# Patient Record
Sex: Male | Born: 1965 | Race: White | Hispanic: No | Marital: Married | Smoking: Never smoker
Health system: Southern US, Community
[De-identification: ages and names within clinical notes are randomized; demographics above are authoritative.]

## PROBLEM LIST (undated history)

## (undated) DIAGNOSIS — I10 Essential (primary) hypertension: Secondary | ICD-10-CM

## (undated) DIAGNOSIS — K219 Gastro-esophageal reflux disease without esophagitis: Secondary | ICD-10-CM

## (undated) DIAGNOSIS — F419 Anxiety disorder, unspecified: Secondary | ICD-10-CM

## (undated) HISTORY — PX: TONSILLECTOMY: SUR1361

---

## 2015-04-15 DIAGNOSIS — IMO0002 Reserved for concepts with insufficient information to code with codable children: Secondary | ICD-10-CM

## 2015-04-15 DIAGNOSIS — E785 Hyperlipidemia, unspecified: Secondary | ICD-10-CM | POA: Diagnosis present

## 2015-05-17 ENCOUNTER — Emergency Department (HOSPITAL_BASED_OUTPATIENT_CLINIC_OR_DEPARTMENT_OTHER): Payer: BLUE CROSS/BLUE SHIELD

## 2015-05-17 ENCOUNTER — Observation Stay (HOSPITAL_BASED_OUTPATIENT_CLINIC_OR_DEPARTMENT_OTHER)
Admission: EM | Admit: 2015-05-17 | Discharge: 2015-05-19 | Disposition: A | Discharge status: Home / Self Care | Payer: BLUE CROSS/BLUE SHIELD | Attending: Surgery | Admitting: Surgery

## 2015-05-17 ENCOUNTER — Encounter (HOSPITAL_BASED_OUTPATIENT_CLINIC_OR_DEPARTMENT_OTHER): Payer: Self-pay | Admitting: Emergency Medicine

## 2015-05-17 DIAGNOSIS — I10 Essential (primary) hypertension: Secondary | ICD-10-CM | POA: Diagnosis not present

## 2015-05-17 DIAGNOSIS — K8010 Calculus of gallbladder with chronic cholecystitis without obstruction: Principal | ICD-10-CM | POA: Insufficient documentation

## 2015-05-17 DIAGNOSIS — K219 Gastro-esophageal reflux disease without esophagitis: Secondary | ICD-10-CM | POA: Diagnosis not present

## 2015-05-17 DIAGNOSIS — D72829 Elevated white blood cell count, unspecified: Secondary | ICD-10-CM

## 2015-05-17 DIAGNOSIS — Z79899 Other long term (current) drug therapy: Secondary | ICD-10-CM | POA: Insufficient documentation

## 2015-05-17 DIAGNOSIS — F419 Anxiety disorder, unspecified: Secondary | ICD-10-CM | POA: Diagnosis present

## 2015-05-17 DIAGNOSIS — Z7982 Long term (current) use of aspirin: Secondary | ICD-10-CM | POA: Insufficient documentation

## 2015-05-17 DIAGNOSIS — IMO0002 Reserved for concepts with insufficient information to code with codable children: Secondary | ICD-10-CM

## 2015-05-17 DIAGNOSIS — K819 Cholecystitis, unspecified: Secondary | ICD-10-CM

## 2015-05-17 DIAGNOSIS — K810 Acute cholecystitis: Secondary | ICD-10-CM | POA: Diagnosis present

## 2015-05-17 DIAGNOSIS — R109 Unspecified abdominal pain: Secondary | ICD-10-CM

## 2015-05-17 DIAGNOSIS — E785 Hyperlipidemia, unspecified: Secondary | ICD-10-CM | POA: Diagnosis not present

## 2015-05-17 DIAGNOSIS — K8020 Calculus of gallbladder without cholecystitis without obstruction: Secondary | ICD-10-CM

## 2015-05-17 HISTORY — DX: Essential (primary) hypertension: I10

## 2015-05-17 HISTORY — DX: Anxiety disorder, unspecified: F41.9

## 2015-05-17 HISTORY — DX: Gastro-esophageal reflux disease without esophagitis: K21.9

## 2015-05-17 LAB — COMPREHENSIVE METABOLIC PANEL
ALT: 34 U/L (ref 17–63)
AST: 28 U/L (ref 15–41)
Albumin: 4.3 g/dL (ref 3.5–5.0)
Alkaline Phosphatase: 77 U/L (ref 38–126)
Anion gap: 14 (ref 5–15)
BUN: 11 mg/dL (ref 6–20)
CHLORIDE: 104 mmol/L (ref 101–111)
CO2: 23 mmol/L (ref 22–32)
Calcium: 9.6 mg/dL (ref 8.9–10.3)
Creatinine, Ser: 0.89 mg/dL (ref 0.61–1.24)
GFR calc Af Amer: >60 mL/min (ref >60)
Glucose, Bld: 134 mg/dL — ABNORMAL HIGH (ref 65–99)
POTASSIUM: 4 mmol/L (ref 3.5–5.1)
SODIUM: 141 mmol/L (ref 135–145)
Total Bilirubin: 0.9 mg/dL (ref 0.3–1.2)
Total Protein: 7.9 g/dL (ref 6.5–8.1)

## 2015-05-17 LAB — CBC
HEMATOCRIT: 43.4 % (ref 39.0–52.0)
Hemoglobin: 14.4 g/dL (ref 13.0–17.0)
MCH: 28.9 pg (ref 26.0–34.0)
MCHC: 33.2 g/dL (ref 30.0–36.0)
MCV: 87.1 fL (ref 78.0–100.0)
Platelets: 323 10*3/uL (ref 150–400)
RBC: 4.98 MIL/uL (ref 4.22–5.81)
RDW: 12.8 % (ref 11.5–15.5)
WBC: 22.1 10*3/uL — AB (ref 4.0–10.5)

## 2015-05-17 LAB — LIPASE, BLOOD: LIPASE: 28 U/L (ref 11–51)

## 2015-05-17 MED ORDER — SODIUM CHLORIDE 0.9 % IV BOLUS (SEPSIS)
1000.0000 mL | Freq: Once | INTRAVENOUS | Status: AC
  Administered 2015-05-18: 1000 mL via INTRAVENOUS

## 2015-05-17 MED ORDER — FENTANYL CITRATE (PF) 100 MCG/2ML IJ SOLN
100.0000 ug | Freq: Once | INTRAMUSCULAR | Status: AC
  Administered 2015-05-17: 100 ug via INTRAVENOUS
  Filled 2015-05-17: qty 2

## 2015-05-17 MED ORDER — ONDANSETRON HCL 4 MG/2ML IJ SOLN
4.0000 mg | Freq: Once | INTRAMUSCULAR | Status: AC
  Administered 2015-05-17: 4 mg via INTRAVENOUS
  Filled 2015-05-17: qty 2

## 2015-05-17 NOTE — ED Notes (Signed)
Patient states that he has had generalized abdominal pain x 4 hours with nausea.

## 2015-05-17 NOTE — ED Provider Notes (Signed)
CSN: [id number]     Arrival date & time 05/17/15  2214 History  By signing my name below, I, Budd Palmer, attest that this documentation has been prepared under the direction and in the presence of Kasten Leveque, MD. Electronically Signed: Budd Palmer, ED Scribe. 05/17/2015. 11:30 PM.    Chief Complaint  Patient presents with  . Abdominal Pain   Patient is a 50 y.o. male presenting with abdominal pain. The history is provided by the patient and the spouse. No language interpreter was used.  Abdominal Pain Pain location:  Epigastric and RUQ Pain quality: sharp   Pain radiates to:  Back Pain severity:  Severe Onset quality:  Gradual Duration:  6 hours Timing:  Constant Progression:  Waxing and waning Chronicity:  New Context: not medication withdrawal, not sick contacts, not suspicious food intake and not trauma   Relieved by:  Nothing Ineffective treatments:  Antacids and OTC medications Associated symptoms: chills and nausea   Associated symptoms: no constipation, no cough, no diarrhea, no dysuria, no flatus, no shortness of breath and no vomiting   Nausea:    Severity:  Mild   Onset quality:  Gradual   Timing:  Constant   Progression:  Resolved Risk factors: no alcohol abuse and not pregnant    HPI Comments: Jeffery Rivas is a 50 y.o. male who presents to the Emergency Department complaining of waxing and waning upper abdominal pain radiating into the back onset 6 hours ago. He currently rates his pain as an 8/10. He reports associated nausea (resolved), chills, and diaphoresis. Per wife, pt has tried taking gas pills without relief. She notes this was the first day pt has taken 500 mg Norvasc. She states pt has recently been sick with a URI when he was prescribed prednisone and doxycycline. She notes that after this pt had 2 syncopal episodes and was admitted to a hospital in [place] on 1/31. Pt notes a PMHx of GERD. His most recent BM was normal and occurred this morning. He  notes he did not eat right before the pain began and denies any strenuous activities today. He denies any recent sick contacts. He also denies a PSHx of hernia repair. Pt denies v/d, constipation, dysuria, congestion, cough, and SOB.   Past Medical History  Diagnosis Date  . Hypertension   . Anxiety   . GERD (gastroesophageal reflux disease)    Past Surgical History  Procedure Laterality Date  . Tonsillectomy     History reviewed. No pertinent family history. Social History  Substance Use Topics  . Smoking status: Never Smoker   . Smokeless tobacco: None  . Alcohol Use: No    Review of Systems  Constitutional: Positive for chills.  HENT: Negative for congestion.   Respiratory: Negative for cough and shortness of breath.   Gastrointestinal: Positive for nausea and abdominal pain. Negative for vomiting, diarrhea, constipation and flatus.  Genitourinary: Negative for dysuria.  All other systems reviewed and are negative.   Allergies  Review of patient's allergies indicates no known allergies.  Home Medications   Prior to Admission medications   Medication Sig Start Date End Date Taking? Authorizing Provider  amLODipine (NORVASC) 5 MG tablet Take 5 mg by mouth daily.   Yes Historical Provider, MD  aspirin 81 MG tablet Take 81 mg by mouth daily.   Yes Historical Provider, MD  citalopram (CELEXA) 10 MG tablet Take 10 mg by mouth daily.   Yes Historical Provider, MD  enalapril (VASOTEC) 10 MG tablet Take  20 mg by mouth 2 (two) times daily.   Yes Historical Provider, MD  loratadine (CLARITIN) 10 MG tablet Take 10 mg by mouth daily.   Yes Historical Provider, MD  omeprazole (PRILOSEC) 40 MG capsule Take 40 mg by mouth daily.   Yes Historical Provider, MD   BP 135/107 mmHg  Pulse 74  Temp(Src) 97.7 F (36.5 C) (Oral)  Resp 18  Ht 5\' 8"  (1.727 m)  Wt 220 lb (99.791 kg)  BMI 33.46 kg/m2  SpO2 100% Physical Exam  Constitutional: He appears well-developed and well-nourished.   HENT:  Head: Normocephalic and atraumatic.  Mouth/Throat: Oropharynx is clear and moist.  Eyes: Conjunctivae are normal. Pupils are equal, round, and reactive to light. Right eye exhibits no discharge. Left eye exhibits no discharge.  Neck: Normal range of motion. Neck supple.  Cardiovascular: Normal rate, regular rhythm and normal heart sounds.   Pulmonary/Chest: Effort normal and breath sounds normal. No respiratory distress. He has no wheezes. He has no rales.  Lungs are CTA  Abdominal: Soft. He exhibits no distension and no mass. There is tenderness in the right upper quadrant and epigastric area. There is guarding. There is no rigidity, no rebound and no tenderness at McBurney's point.  Neurological: He is alert. Coordination normal.  Skin: Skin is warm and dry. No rash noted. He is not diaphoretic. No erythema.  Psychiatric: He has a normal mood and affect.  Nursing note and vitals reviewed.   ED Course  Procedures  DIAGNOSTIC STUDIES: Oxygen Saturation is 100% on RA, normal by my interpretation.    COORDINATION OF CARE: 11:24 PM - Discussed plans to review pt's medical records and wait on diagnostic studies. Pt advised of plan for treatment and pt agrees.  Labs Review Labs Reviewed  CBC - Abnormal; Notable for the following:    WBC 22.1 (*)    All other components within normal limits  LIPASE, BLOOD  COMPREHENSIVE METABOLIC PANEL  URINALYSIS, ROUTINE W REFLEX MICROSCOPIC (NOT AT [facility])  TROPONIN I    Imaging Review No results found. I have personally reviewed and evaluated these images and lab results as part of my medical decision-making.   EKG Interpretation None      MDM   Final diagnoses:  None     Date: 05/18/2015  Rate: 68  Rhythm: normal sinus rhythm  QRS Axis: normal  Intervals: normal  ST/T Wave abnormalities: normal  Conduction Disutrbances: none  Narrative Interpretation: unremarkable  Medications  fentaNYL (SUBLIMAZE) injection 100 mcg  (not administered)  fentaNYL (SUBLIMAZE) injection 100 mcg (100 mcg Intravenous Given 05/17/15 2335)  ondansetron (ZOFRAN) injection 4 mg (4 mg Intravenous Given 05/17/15 2335)  sodium chloride 0.9 % bolus 1,000 mL (0 mLs Intravenous Stopped 05/18/15 0156)  iohexol (OMNIPAQUE) 300 MG/ML solution 25 mL (25 mLs Oral Contrast Given 05/18/15 0045)  iohexol (OMNIPAQUE) 300 MG/ML solution 100 mL (100 mLs Intravenous Contrast Given 05/18/15 0141)  morphine 4 MG/ML injection 4 mg (4 mg Intravenous Given 05/18/15 0157)  morphine 2 MG/ML injection 2 mg (2 mg Intravenous Given 05/18/15 0245)   Results for orders placed or performed during the hospital encounter of 05/17/15  Lipase, blood  Result Value Ref Range   Lipase 28 11 - 51 U/L  Comprehensive metabolic panel  Result Value Ref Range   Sodium 141 135 - 145 mmol/L   Potassium 4.0 3.5 - 5.1 mmol/L   Chloride 104 101 - 111 mmol/L   CO2 23 22 - 32 mmol/L  Glucose, Bld 134 (H) 65 - 99 mg/dL   BUN 11 6 - 20 mg/dL   Creatinine, Ser 1.61 0.61 - 1.24 mg/dL   Calcium 9.6 8.9 - 09.6 mg/dL   Total Protein 7.9 6.5 - 8.1 g/dL   Albumin 4.3 3.5 - 5.0 g/dL   AST 28 15 - 41 U/L   ALT 34 17 - 63 U/L   Alkaline Phosphatase 77 38 - 126 U/L   Total Bilirubin 0.9 0.3 - 1.2 mg/dL   GFR calc non Af Amer >60 >60 mL/min   GFR calc Af Amer >60 >60 mL/min   Anion gap 14 5 - 15  CBC  Result Value Ref Range   WBC 22.1 (H) 4.0 - 10.5 K/uL   RBC 4.98 4.22 - 5.81 MIL/uL   Hemoglobin 14.4 13.0 - 17.0 g/dL   HCT 04.5 40.9 - 81.1 %   MCV 87.1 78.0 - 100.0 fL   MCH 28.9 26.0 - 34.0 pg   MCHC 33.2 30.0 - 36.0 g/dL   RDW 91.4 78.2 - 95.6 %   Platelets 323 150 - 400 K/uL  Urinalysis, Routine w reflex microscopic (not at [facility])  Result Value Ref Range   Color, Urine YELLOW YELLOW   APPearance CLEAR CLEAR   Specific Gravity, Urine 1.021 1.005 - 1.030   pH 6.0 5.0 - 8.0   Glucose, UA NEGATIVE NEGATIVE mg/dL   Hgb urine dipstick NEGATIVE NEGATIVE   Bilirubin Urine  NEGATIVE NEGATIVE   Ketones, ur >80 (A) NEGATIVE mg/dL   Protein, ur NEGATIVE NEGATIVE mg/dL   Nitrite NEGATIVE NEGATIVE   Leukocytes, UA NEGATIVE NEGATIVE  Troponin I  Result Value Ref Range   Troponin I <0.03 <0.031 ng/mL   Dg Chest 2 View  05/18/2015  CLINICAL DATA:  50 year old male with upper abdominal pain EXAM: CHEST  2 VIEW COMPARISON:  None. FINDINGS: The heart size and mediastinal contours are within normal limits. Both lungs are clear. The visualized skeletal structures are unremarkable. IMPRESSION: No active cardiopulmonary disease. Electronically Signed   By: Elgie Collard M.D.   On: 05/18/2015 00:27   Ct Abdomen Pelvis W Contrast  05/18/2015  CLINICAL DATA:  Acute onset of upper abdominal pain, radiating to the back. Nausea, chills and diaphoresis. Recent syncope. Initial encounter. EXAM: CT ABDOMEN AND PELVIS WITH CONTRAST TECHNIQUE: Multidetector CT imaging of the abdomen and pelvis was performed using the standard protocol following bolus administration of intravenous contrast. CONTRAST:  OMNIPAQUE IOHEXOL 300 MG/ML  SOLN COMPARISON:  None. FINDINGS: The visualized lung bases are clear. The liver and spleen are unremarkable in appearance. There is suggestion of mild soft tissue edema about the gallbladder, which may reflect mild cholecystitis. Would correlate with the patient's symptoms. The pancreas and adrenal glands are unremarkable. The kidneys are unremarkable in appearance. There is no evidence of hydronephrosis. No renal or ureteral stones are seen. Nonspecific perinephric stranding is noted bilaterally. No free fluid is identified. The small bowel is unremarkable in appearance. The stomach is within normal limits. No acute vascular abnormalities are seen. Minimal calcification is noted along the distal abdominal aorta. The appendix is normal in caliber, without evidence of appendicitis. The colon is largely decompressed and grossly unremarkable in appearance. The  bladder is mildly distended and grossly unremarkable. The prostate remains normal in size. No inguinal lymphadenopathy is seen. No acute osseous abnormalities are identified. IMPRESSION: Suggestion of mild soft tissue edema about the gallbladder, which may reflect mild cholecystitis. Would correlate with the patient's symptoms.  Electronically Signed   By: Roanna Raider M.D.   On: 05/18/2015 02:11    Case d/w Dr. Ezzard Standing, ultrasound of the gallbladder to ED at [facility] surgery will see Case d/w Dr. Lynelle Doctor in ED  I personally performed the services described in this documentation, which was scribed in my presence. The recorded information has been reviewed and is accurate.     Cy Blamer, MD 05/18/15 [phone]

## 2015-05-18 ENCOUNTER — Encounter (HOSPITAL_BASED_OUTPATIENT_CLINIC_OR_DEPARTMENT_OTHER): Payer: Self-pay | Admitting: Emergency Medicine

## 2015-05-18 ENCOUNTER — Observation Stay (HOSPITAL_COMMUNITY): Payer: BLUE CROSS/BLUE SHIELD

## 2015-05-18 ENCOUNTER — Encounter (HOSPITAL_COMMUNITY)
Admission: EM | Disposition: A | Discharge status: Home / Self Care | Payer: Self-pay | Source: Home / Self Care | Attending: Emergency Medicine

## 2015-05-18 ENCOUNTER — Emergency Department (HOSPITAL_BASED_OUTPATIENT_CLINIC_OR_DEPARTMENT_OTHER): Payer: BLUE CROSS/BLUE SHIELD

## 2015-05-18 ENCOUNTER — Observation Stay (HOSPITAL_COMMUNITY): Payer: BLUE CROSS/BLUE SHIELD | Admitting: Anesthesiology

## 2015-05-18 ENCOUNTER — Emergency Department (HOSPITAL_COMMUNITY): Payer: BLUE CROSS/BLUE SHIELD

## 2015-05-18 DIAGNOSIS — K810 Acute cholecystitis: Secondary | ICD-10-CM | POA: Diagnosis present

## 2015-05-18 DIAGNOSIS — K219 Gastro-esophageal reflux disease without esophagitis: Secondary | ICD-10-CM | POA: Diagnosis present

## 2015-05-18 DIAGNOSIS — I10 Essential (primary) hypertension: Secondary | ICD-10-CM | POA: Diagnosis present

## 2015-05-18 DIAGNOSIS — F419 Anxiety disorder, unspecified: Secondary | ICD-10-CM | POA: Diagnosis present

## 2015-05-18 HISTORY — PX: CHOLECYSTECTOMY: SHX55

## 2015-05-18 LAB — SURGICAL PCR SCREEN
MRSA, PCR: NEGATIVE
Staphylococcus aureus: NEGATIVE

## 2015-05-18 LAB — URINALYSIS, ROUTINE W REFLEX MICROSCOPIC
BILIRUBIN URINE: NEGATIVE
Glucose, UA: NEGATIVE mg/dL
HGB URINE DIPSTICK: NEGATIVE
Leukocytes, UA: NEGATIVE
Nitrite: NEGATIVE
Protein, ur: NEGATIVE mg/dL
SPECIFIC GRAVITY, URINE: 1.021 (ref 1.005–1.030)
pH: 6 (ref 5.0–8.0)

## 2015-05-18 LAB — TROPONIN I: Troponin I: <0.03 ng/mL (ref <0.031)

## 2015-05-18 SURGERY — LAPAROSCOPIC CHOLECYSTECTOMY WITH INTRAOPERATIVE CHOLANGIOGRAM
Anesthesia: General | Site: Abdomen

## 2015-05-18 MED ORDER — MIDAZOLAM HCL 5 MG/5ML IJ SOLN
INTRAMUSCULAR | Status: DC | PRN
  Administered 2015-05-18: 2 mg via INTRAVENOUS

## 2015-05-18 MED ORDER — IBUPROFEN 200 MG PO TABS: 600.0000 mg | ORAL_TABLET | Freq: Four times a day (QID) | ORAL | Status: DC | PRN

## 2015-05-18 MED ORDER — ONDANSETRON HCL 4 MG/2ML IJ SOLN
4.0000 mg | Freq: Four times a day (QID) | INTRAMUSCULAR | Status: DC | PRN
  Administered 2015-05-18 (×2): 4 mg via INTRAVENOUS
  Filled 2015-05-18 (×2): qty 2

## 2015-05-18 MED ORDER — PIPERACILLIN-TAZOBACTAM 3.375 G IVPB
3.3750 g | Freq: Three times a day (TID) | INTRAVENOUS | Status: DC
  Administered 2015-05-18 – 2015-05-19 (×4): 3.375 g via INTRAVENOUS
  Filled 2015-05-18 (×5): qty 50

## 2015-05-18 MED ORDER — HYDROCODONE-ACETAMINOPHEN 5-325 MG PO TABS
1.0000 | ORAL_TABLET | ORAL | Status: DC | PRN
  Administered 2015-05-19: 1 via ORAL
  Filled 2015-05-18: qty 1

## 2015-05-18 MED ORDER — ONDANSETRON 4 MG PO TBDP: 4.0000 mg | ORAL_TABLET | Freq: Four times a day (QID) | ORAL | Status: DC | PRN

## 2015-05-18 MED ORDER — BUPIVACAINE-EPINEPHRINE (PF) 0.25% -1:200000 IJ SOLN
INTRAMUSCULAR | Status: AC
  Filled 2015-05-18: qty 30

## 2015-05-18 MED ORDER — HEPARIN SODIUM (PORCINE) 5000 UNIT/ML IJ SOLN
5000.0000 [IU] | Freq: Three times a day (TID) | INTRAMUSCULAR | Status: DC
  Administered 2015-05-18 – 2015-05-19 (×2): 5000 [IU] via SUBCUTANEOUS
  Filled 2015-05-18 (×5): qty 1

## 2015-05-18 MED ORDER — FENTANYL CITRATE (PF) 250 MCG/5ML IJ SOLN
INTRAMUSCULAR | Status: AC
  Filled 2015-05-18: qty 5

## 2015-05-18 MED ORDER — LIDOCAINE HCL (CARDIAC) 20 MG/ML IV SOLN
INTRAVENOUS | Status: DC | PRN
  Administered 2015-05-18: 100 mg via INTRAVENOUS

## 2015-05-18 MED ORDER — FENTANYL CITRATE (PF) 100 MCG/2ML IJ SOLN
25.0000 ug | INTRAMUSCULAR | Status: DC | PRN
  Administered 2015-05-18 (×2): 25 ug via INTRAVENOUS
  Filled 2015-05-18 (×2): qty 2

## 2015-05-18 MED ORDER — IOHEXOL 300 MG/ML  SOLN
INTRAMUSCULAR | Status: DC | PRN
  Administered 2015-05-18: 5 mL

## 2015-05-18 MED ORDER — ONDANSETRON HCL 4 MG/2ML IJ SOLN: 4.0000 mg | Freq: Once | INTRAMUSCULAR | Status: DC | PRN

## 2015-05-18 MED ORDER — LACTATED RINGERS IR SOLN
Status: DC | PRN
  Administered 2015-05-18: 1

## 2015-05-18 MED ORDER — MORPHINE SULFATE (PF) 4 MG/ML IV SOLN: 4.0000 mg | Freq: Once | INTRAVENOUS | Status: DC

## 2015-05-18 MED ORDER — KCL IN DEXTROSE-NACL 20-5-0.45 MEQ/L-%-% IV SOLN
INTRAVENOUS | Status: DC
  Administered 2015-05-18: 100 mL/h via INTRAVENOUS
  Administered 2015-05-19: 02:00:00 via INTRAVENOUS
  Filled 2015-05-18 (×3): qty 1000

## 2015-05-18 MED ORDER — PIPERACILLIN-TAZOBACTAM 3.375 G IVPB: 3.3750 g | Freq: Three times a day (TID) | INTRAVENOUS | Status: DC

## 2015-05-18 MED ORDER — LACTATED RINGERS IV SOLN: INTRAVENOUS | Status: DC

## 2015-05-18 MED ORDER — ONDANSETRON HCL 4 MG/2ML IJ SOLN: 4.0000 mg | Freq: Four times a day (QID) | INTRAMUSCULAR | Status: DC | PRN

## 2015-05-18 MED ORDER — ONDANSETRON HCL 4 MG/2ML IJ SOLN
INTRAMUSCULAR | Status: AC
  Filled 2015-05-18: qty 2

## 2015-05-18 MED ORDER — LIDOCAINE HCL (CARDIAC) 20 MG/ML IV SOLN
INTRAVENOUS | Status: AC
  Filled 2015-05-18: qty 5

## 2015-05-18 MED ORDER — LACTATED RINGERS IV SOLN
INTRAVENOUS | Status: DC | PRN
  Administered 2015-05-18: 10:00:00 via INTRAVENOUS

## 2015-05-18 MED ORDER — MORPHINE SULFATE (PF) 2 MG/ML IV SOLN: 1.0000 mg | INTRAVENOUS | Status: DC | PRN

## 2015-05-18 MED ORDER — FENTANYL CITRATE (PF) 100 MCG/2ML IJ SOLN
INTRAMUSCULAR | Status: AC
  Filled 2015-05-18: qty 2

## 2015-05-18 MED ORDER — PIPERACILLIN-TAZOBACTAM 3.375 G IVPB
INTRAVENOUS | Status: AC
  Filled 2015-05-18: qty 50

## 2015-05-18 MED ORDER — MEPERIDINE HCL 50 MG/ML IJ SOLN: 6.2500 mg | INTRAMUSCULAR | Status: DC | PRN

## 2015-05-18 MED ORDER — HYDROMORPHONE HCL 1 MG/ML IJ SOLN: 0.2500 mg | INTRAMUSCULAR | Status: DC | PRN

## 2015-05-18 MED ORDER — SUGAMMADEX SODIUM 200 MG/2ML IV SOLN
INTRAVENOUS | Status: DC | PRN
  Administered 2015-05-18: 200 mg via INTRAVENOUS

## 2015-05-18 MED ORDER — IOHEXOL 300 MG/ML  SOLN
25.0000 mL | Freq: Once | INTRAMUSCULAR | Status: AC | PRN
  Administered 2015-05-18: 25 mL via ORAL

## 2015-05-18 MED ORDER — FENTANYL CITRATE (PF) 100 MCG/2ML IJ SOLN
100.0000 ug | Freq: Once | INTRAMUSCULAR | Status: AC
  Administered 2015-05-18: 100 ug via INTRAVENOUS
  Filled 2015-05-18: qty 2

## 2015-05-18 MED ORDER — FENTANYL CITRATE (PF) 100 MCG/2ML IJ SOLN
50.0000 ug | INTRAMUSCULAR | Status: DC | PRN
  Administered 2015-05-18: 50 ug via INTRAVENOUS
  Filled 2015-05-18: qty 2

## 2015-05-18 MED ORDER — SUGAMMADEX SODIUM 200 MG/2ML IV SOLN
INTRAVENOUS | Status: AC
  Filled 2015-05-18: qty 2

## 2015-05-18 MED ORDER — LABETALOL HCL 5 MG/ML IV SOLN
INTRAVENOUS | Status: DC | PRN
  Administered 2015-05-18 (×2): 5 mg via INTRAVENOUS

## 2015-05-18 MED ORDER — ONDANSETRON HCL 4 MG/2ML IJ SOLN
INTRAMUSCULAR | Status: DC | PRN
  Administered 2015-05-18: 4 mg via INTRAVENOUS

## 2015-05-18 MED ORDER — 0.9 % SODIUM CHLORIDE (POUR BTL) OPTIME
TOPICAL | Status: DC | PRN
  Administered 2015-05-18: 1000 mL

## 2015-05-18 MED ORDER — LABETALOL HCL 5 MG/ML IV SOLN
INTRAVENOUS | Status: AC
  Filled 2015-05-18: qty 4

## 2015-05-18 MED ORDER — MIDAZOLAM HCL 2 MG/2ML IJ SOLN
INTRAMUSCULAR | Status: AC
  Filled 2015-05-18: qty 2

## 2015-05-18 MED ORDER — ROCURONIUM BROMIDE 100 MG/10ML IV SOLN
INTRAVENOUS | Status: AC
  Filled 2015-05-18: qty 1

## 2015-05-18 MED ORDER — IOHEXOL 300 MG/ML  SOLN
100.0000 mL | Freq: Once | INTRAMUSCULAR | Status: AC | PRN
  Administered 2015-05-18: 100 mL via INTRAVENOUS

## 2015-05-18 MED ORDER — PROPOFOL 10 MG/ML IV BOLUS
INTRAVENOUS | Status: DC | PRN
Start: 2015-05-18 — End: 2015-05-18
  Administered 2015-05-18: 250 mg via INTRAVENOUS

## 2015-05-18 MED ORDER — MORPHINE SULFATE (PF) 2 MG/ML IV SOLN
2.0000 mg | Freq: Once | INTRAVENOUS | Status: AC
  Administered 2015-05-18: 2 mg via INTRAVENOUS
  Filled 2015-05-18: qty 1

## 2015-05-18 MED ORDER — MORPHINE SULFATE (PF) 4 MG/ML IV SOLN
4.0000 mg | Freq: Once | INTRAVENOUS | Status: AC
Start: 2015-05-18 — End: 2015-05-18
  Administered 2015-05-18: 4 mg via INTRAVENOUS
  Filled 2015-05-18: qty 1

## 2015-05-18 MED ORDER — FENTANYL CITRATE (PF) 100 MCG/2ML IJ SOLN
INTRAMUSCULAR | Status: DC | PRN
  Administered 2015-05-18 (×6): 50 ug via INTRAVENOUS

## 2015-05-18 MED ORDER — PROPOFOL 10 MG/ML IV BOLUS
INTRAVENOUS | Status: AC
  Filled 2015-05-18: qty 20

## 2015-05-18 MED ORDER — BUPIVACAINE-EPINEPHRINE 0.25% -1:200000 IJ SOLN
INTRAMUSCULAR | Status: DC | PRN
  Administered 2015-05-18: 30 mL

## 2015-05-18 MED ORDER — ROCURONIUM BROMIDE 100 MG/10ML IV SOLN
INTRAVENOUS | Status: DC | PRN
  Administered 2015-05-18: 10 mg via INTRAVENOUS
  Administered 2015-05-18: 50 mg via INTRAVENOUS

## 2015-05-18 SURGICAL SUPPLY — 35 items
APPLIER CLIP 5 13 M/L LIGAMAX5 (MISCELLANEOUS)
APPLIER CLIP ROT 10 11.4 M/L (STAPLE) ×3
BENZOIN TINCTURE PRP APPL 2/3 (GAUZE/BANDAGES/DRESSINGS) ×3 IMPLANT
CABLE HIGH FREQUENCY MONO STRZ (ELECTRODE) ×3 IMPLANT
CHLORAPREP W/TINT 26ML (MISCELLANEOUS) ×3 IMPLANT
CHOLANGIOGRAM CATH TAUT (CATHETERS) ×3 IMPLANT
CLIP APPLIE 5 13 M/L LIGAMAX5 (MISCELLANEOUS) IMPLANT
CLIP APPLIE ROT 10 11.4 M/L (STAPLE) ×1 IMPLANT
CLOSURE WOUND 1/4X4 (GAUZE/BANDAGES/DRESSINGS) ×1
COVER MAYO STAND STRL (DRAPES) ×3 IMPLANT
COVER SURGICAL LIGHT HANDLE (MISCELLANEOUS) ×3 IMPLANT
DECANTER SPIKE VIAL GLASS SM (MISCELLANEOUS) ×3 IMPLANT
DRAPE C-ARM 42X120 X-RAY (DRAPES) ×3 IMPLANT
DRAPE LAPAROSCOPIC ABDOMINAL (DRAPES) ×3 IMPLANT
ELECT REM PT RETURN 9FT ADLT (ELECTROSURGICAL) ×3
ELECTRODE REM PT RTRN 9FT ADLT (ELECTROSURGICAL) ×1 IMPLANT
GLOVE SURG SIGNA 7.5 PF LTX (GLOVE) ×3 IMPLANT
GOWN STRL REUS W/TWL XL LVL3 (GOWN DISPOSABLE) ×9 IMPLANT
HEMOSTAT SURGICEL 4X8 (HEMOSTASIS) IMPLANT
IV CATH 14GX2 1/4 (CATHETERS) ×3 IMPLANT
IV SET EXTENSION CATH 6 NF (IV SETS) ×3 IMPLANT
KIT BASIN OR (CUSTOM PROCEDURE TRAY) ×3 IMPLANT
LIQUID BAND (GAUZE/BANDAGES/DRESSINGS) ×3 IMPLANT
POUCH SPECIMEN RETRIEVAL 10MM (ENDOMECHANICALS) ×3 IMPLANT
SCISSORS LAP 5X35 DISP (ENDOMECHANICALS) ×3 IMPLANT
SET IRRIG TUBING LAPAROSCOPIC (IRRIGATION / IRRIGATOR) ×3 IMPLANT
SLEEVE XCEL OPT CAN 5 100 (ENDOMECHANICALS) ×3 IMPLANT
STOPCOCK 4 WAY LG BORE MALE ST (IV SETS) ×3 IMPLANT
STRIP CLOSURE SKIN 1/4X4 (GAUZE/BANDAGES/DRESSINGS) ×2 IMPLANT
SUT MON AB 5-0 PS2 18 (SUTURE) ×3 IMPLANT
TOWEL OR 17X26 10 PK STRL BLUE (TOWEL DISPOSABLE) ×3 IMPLANT
TRAY LAPAROSCOPIC (CUSTOM PROCEDURE TRAY) ×3 IMPLANT
TROCAR BLADELESS OPT 5 100 (ENDOMECHANICALS) ×3 IMPLANT
TROCAR XCEL BLUNT TIP 100MML (ENDOMECHANICALS) ×3 IMPLANT
TROCAR XCEL NON-BLD 11X100MML (ENDOMECHANICALS) ×3 IMPLANT

## 2015-05-18 NOTE — H&P (Addendum)
Re:   Jeffery Rivas DOB:   [birth year] MRN:   [id number]  ASSESSMENT AND PLAN: 1.  Cholecystitis and cholelithiasis  I discussed with the patient the indications and risks of gall bladder surgery.  The primary risks of gall bladder surgery include, but are not limited to, bleeding, infection, common bile duct injury, and open surgery.  There is also the risk that the patient may have continued symptoms after surgery.  We discussed the typical post-operative recovery course. I tried to answer the patient's questions.  2.  HTN 3. GERD 4.  History of anxiety  Chief Complaint  Patient presents with  . Abdominal Pain   REFERRING PHYSICIAN:  Dr. Devoria Albe, [facility]  HISTORY OF PRESENT ILLNESS: Jeffery Rivas is a 50 y.o. (DOB: 1965-06-01)  white  male whose primary care physician is Sid Falcon, MD and comes to from [facility] today for abdominal pain and probable gall bladder disease. His wife, Jeffery Rivas, is in the room with him.   About 5:30PM yesterday, he developed fairly sudden epigastric pain.  He thought it was gas pain at first, but when it did not improve, he went to [facility].  He had a colonoscopy and upper endo in [place] within the last year which was negative.  He has GERD controlled with Prilosec.  He has a remote history of elevated LFT's attributed to meds.  He has no liver, pancreas, or colon disease.  He has no prior abdominal surgery.  CT scan abdomen - 05/18/2015 - Suggestion of mild soft tissue edema about the gallbladder, which may reflect mild cholecystitis. [place] of abdomen - 05/18/2015 - gallstones    Past Medical History  Diagnosis Date  . Hypertension   . Anxiety   . GERD (gastroesophageal reflux disease)       Past Surgical History  Procedure Laterality Date  . Tonsillectomy        Current Facility-Administered Medications  Medication Dose Route Frequency Provider Last Rate Last Dose  . morphine 2 MG/ML injection 1-4 mg  1-4 mg  Intravenous Q2H PRN Ovidio Kin, MD       Current Outpatient Prescriptions  Medication Sig Dispense Refill  . amLODipine (NORVASC) 5 MG tablet Take 5 mg by mouth daily.    Marland Kitchen aspirin 81 MG tablet Take 81 mg by mouth daily.    . cholecalciferol (VITAMIN D) 1000 units tablet Take 1,000 Units by mouth daily.    . citalopram (CELEXA) 10 MG tablet Take 10 mg by mouth daily.    . enalapril (VASOTEC) 10 MG tablet Take 10 mg by mouth 2 (two) times daily.     Marland Kitchen loratadine (CLARITIN) 10 MG tablet Take 10 mg by mouth daily.    Marland Kitchen omeprazole (PRILOSEC) 40 MG capsule Take 40 mg by mouth daily.       No Known Allergies  REVIEW OF SYSTEMS: Skin:  No history of rash.  No history of abnormal moles. Infection:  No history of hepatitis or HIV.  No history of MRSA. Neurologic:  No history of stroke.  No history of seizure.  No history of headaches. Cardiac:  HTN x 5 years.  He had a syncopal episode 6 weeks ago in [place] (at his wife's mother's funeral) which he was evaluated for stroke and card disease and was neg. Pulmonary:  Does not smoke cigarettes.  He has had the occasional bronchitis.  Endocrine:  No diabetes. No thyroid disease. Gastrointestinal:  See HPI. Urologic:  No history of Rivas stones.  No history of bladder infections. Musculoskeletal:  No history of joint or back disease. Hematologic:  No bleeding disorder.  No history of anemia.  Not anticoagulated. Psycho-social:  The patient is oriented.   The patient has no obvious psychologic or social impairment to understanding our conversation and plan.  SOCIAL and FAMILY HISTORY: Married.  Wife, Jeffery Rivas, is with him. No children. 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  HEENT:  Normal. Pupils equal. Neck: Supple. No mass.  No thyroid mass. Lymph Nodes:  No supraclavicular or cervical nodes. Lungs: Clear to auscultation and symmetric breath sounds. Heart:  RRR. No murmur or rub. Abdomen: Soft. No mass. Tender RUQ. Few bowel sounds.  No abdominal scars. Rectal: Not done. Extremities:  Good strength and ROM  in upper and lower extremities. Neurologic:  Grossly intact to motor and sensory function. Psychiatric: Has normal mood and affect. Behavior is normal.   DATA REVIEWED: Epic notes  Ovidio Kin, MD,  [facility] [address]  [address]   [place], [place]    [address] Phone:  [phone] FAX:  [phone]

## 2015-05-18 NOTE — ED Notes (Signed)
Returned from xray

## 2015-05-18 NOTE — ED Notes (Signed)
Report given to [place], CN at [facility]

## 2015-05-18 NOTE — Anesthesia Preprocedure Evaluation (Addendum)
Anesthesia Evaluation  Patient identified by MRN, date of birth, ID band Patient awake    Reviewed: Allergy & Precautions, NPO status , Patient's Chart, lab work & pertinent test results  Airway Mallampati: I  TM Distance: >3 FB Neck ROM: Full    Dental   Pulmonary    Pulmonary exam normal        Cardiovascular hypertension, Pt. on medications Normal cardiovascular exam     Neuro/Psych Anxiety    GI/Hepatic GERD  Medicated and Controlled,  Endo/Other    Renal/GU      Musculoskeletal   Abdominal   Peds  Hematology   Anesthesia Other Findings   Reproductive/Obstetrics                          Anesthesia Physical Anesthesia Plan  ASA: II  Anesthesia Plan: General   Post-op Pain Management:    Induction: Intravenous  Airway Management Planned: Oral ETT  Additional Equipment:   Intra-op Plan:   Post-operative Plan: Extubation in OR  Informed Consent: I have reviewed the patients History and Physical, chart, labs and discussed the procedure including the risks, benefits and alternatives for the proposed anesthesia with the patient or authorized representative who has indicated his/her understanding and acceptance.     Plan Discussed with: CRNA and Surgeon  Anesthesia Plan Comments:         Anesthesia Quick Evaluation

## 2015-05-18 NOTE — Transfer of Care (Signed)
Immediate Anesthesia Transfer of Care Note  Patient: Jeffery Rivas  Procedure(s) Performed: Procedure(s): LAPAROSCOPIC CHOLECYSTECTOMY WITH INTRAOPERATIVE CHOLANGIOGRAM (N/A)  Patient Location: PACU  Anesthesia Type:General  Level of Consciousness: awake, alert , oriented and patient cooperative  Airway & Oxygen Therapy: Patient Spontanous Breathing and Patient connected to face mask oxygen  Post-op Assessment: Report given to RN, Post -op Vital signs reviewed and stable and Patient moving all extremities  Post vital signs: Reviewed and stable  Last Vitals:  Filed Vitals:   05/18/15 0700 05/18/15 0726  BP: 161/103   Pulse: 94   Temp:    Resp:  16    Complications: No apparent anesthesia complications

## 2015-05-18 NOTE — ED Notes (Addendum)
Pt states he feels better after fentanyl.  Aware of plan to transfer to [facility]. Questions answered.

## 2015-05-18 NOTE — ED Notes (Signed)
Bed: WU98 Expected date:  Expected time:  Means of arrival:  Comments: Transfer from [facility]

## 2015-05-18 NOTE — ED Notes (Signed)
[facility] here to transport pt to [facility]

## 2015-05-18 NOTE — ED Notes (Signed)
Report given to [facility] with [facility]

## 2015-05-18 NOTE — Anesthesia Postprocedure Evaluation (Signed)
Anesthesia Post Note  Patient: Jeffery Rivas  Procedure(s) Performed: Procedure(s) (LRB): LAPAROSCOPIC CHOLECYSTECTOMY WITH INTRAOPERATIVE CHOLANGIOGRAM (N/A)  Patient location during evaluation: PACU Anesthesia Type: General Level of consciousness: awake and alert Pain management: pain level controlled Vital Signs Assessment: post-procedure vital signs reviewed and stable Respiratory status: spontaneous breathing, nonlabored ventilation, respiratory function stable and patient connected to nasal cannula oxygen Cardiovascular status: blood pressure returned to baseline and stable Postop Assessment: no signs of nausea or vomiting Anesthetic complications: no    Last Vitals:  Filed Vitals:   05/18/15 0726 05/18/15 1200  BP:    Pulse:    Temp:  36.9 C  Resp: 16     Last Pain:  Filed Vitals:   05/18/15 1203  PainSc: Asleep                 Odies Desa DAVID

## 2015-05-18 NOTE — Op Note (Signed)
05/17/2015 - 05/18/2015  11:48 AM  PATIENT:  Jeffery Rivas, 50 y.o., male, MRN: [id number]  PREOP DIAGNOSIS:  Cholecystitis, cholelithias  POSTOP DIAGNOSIS:   Acute hemorrhagic cholecystitis, cholelithias  PROCEDURE:   Procedure(s): LAPAROSCOPIC CHOLECYSTECTOMY WITH INTRAOPERATIVE CHOLANGIOGRAM  SURGEON:   Ovidio Kin, M.D.  ASSISTANT:   None  ANESTHESIA:   general  Anesthesiologist: Arta Bruce, MD CRNA: Elisabeth Cara, CRNA  General  ASA: 2E  EBL:  minimal  ml  BLOOD ADMINISTERED: none  DRAINS: none   LOCAL MEDICATIONS USED:   30 cc 1/4% marcaine  SPECIMEN:   Gall bladder  COUNTS CORRECT:  YES  INDICATIONS FOR PROCEDURE:  Abid Bolla is a 50 y.o. (DOB: 08/20/1965) white  male whose primary care physician is Sid Falcon, MD and comes for cholecystectomy.   The indications and risks of the gall bladder surgery were explained to the patient.  The risks include, but are not limited to, infection, bleeding, common bile duct injury and open surgery.  SURGERY:  The patient was taken to room #1 at [facility].  The abdomen was prepped with chloroprep.  The patient was on Zosyn prior to the beginning of the operation.   A time out was held and the surgical checklist run.   An infraumbilical incision was made into the abdominal cavity.  A 12 mm Hasson trocar was inserted into the abdominal cavity through the infraumbilical incision and secured with a 0 Vicryl suture.  Three additional trocars were inserted: a 10 mm trocar in the sub-xiphoid location, a 5 mm trocar in the right mid subcostal area, and a 5 mm trocar in the right lateral subcostal area.   The abdomen was explored and the liver, stomach, and bowel that could be seen were unremarkable.  The liver was high under the costal margin.   The gall bladder was edematous and hemorrhagic, consistent with acute hemorrhagic cholecystitis.  It was tense and I decompressed it with the laparoscopic needle.  I aspirate  "white" bile from the gall bladder.   I grasped, and rotated the gall bladder cephalad.  Disssection was carried down to the gall bladder/cystic duct junction and the cystic duct isolated.  The lower 1/2 of the gall bladder was edematous.  A clip was placed on the gall bladder side of the cystic duct.   An intra-operative cholangiogram was shot.   The intra-operative cholangiogram was shot using a cut off Taut catheter placed through a 14 gauge angiocath in the RUQ.  The Taut catheter was inserted in the cut cystic duct and secured with an endoclip.  A cholangiogram was shot with 10 cc of 1/2 strength Omnipaque.  Using fluoroscopy, the cholangiogram showed the flow of contrast into the common bile duct, up the hepatic radicals, and into the duodenum.  There was no mass or obstruction.  This was a normal intra-operative cholangiogram.   The Taut catheter was removed.  The cystic duct was tripley endoclipped and the cystic artery was identified and clipped.  The gall bladder was bluntly and sharpley dissected from the gall bladder bed.   After the gall bladder was removed from the liver, the gall bladder bed and [place] of [place] were inspected.  There was no bleeding or bile leak.  The gall bladder was placed in a endocatch bag and delivered through the umbilicus.  The gall bladder was about half full of stones. The abdomen was irrigated with 800 cc saline.   The trocars were then removed.  I infiltrated  30 cc of 1/4% Marcaine into the incisions.  The umbilical port closed with a 0 Vicryl suture and the skin closed with 5-0 Monocryl.  The skin was painted with LiquidBand.  The patient's sponge and needle count were correct.  The patient was transported to the [facility] in good condition.  Ovidio Kin, MD, [facility] Pager: [phone] Office phone:  [phone]

## 2015-05-18 NOTE — ED Provider Notes (Signed)
5:22 AM  Patient care assumed from Dr. Nicanor Alcon in transfer from [facility]. Patient states pain is well controlled at 2-3/10 at present. He is tender in his epigastric abdomen on my assessment. Negative Murphy's sign at this time; no peritoneal signs. Patient with [place] which shows gallstones; no cholecystitis. Plan to re-consult [facility], Dr. Ezzard Standing, regarding patient case. Patient declines additional pain medicine at this time.  5:33 AM Case discussed with Dr. Ezzard Standing. [facility] to eval in the ED for admission.   Filed Vitals:   05/18/15 0330 05/18/15 0345 05/18/15 0352 05/18/15 0447  BP: 151/100   146/97  Pulse: 96 95  80  Temp:   97.9 F (36.6 C) 98.3 F (36.8 C)  TempSrc:   Oral Oral  Resp: Height:      Weight:      SpO2: 99% 98%  98%    Results for orders placed or performed during the hospital encounter of 05/17/15  Lipase, blood  Result Value Ref Range   Lipase 28 11 - 51 U/L  Comprehensive metabolic panel  Result Value Ref Range   Sodium 141 135 - 145 mmol/L   Potassium 4.0 3.5 - 5.1 mmol/L   Chloride 104 101 - 111 mmol/L   CO2 23 22 - 32 mmol/L   Glucose, Bld 134 (H) 65 - 99 mg/dL   BUN 11 6 - 20 mg/dL   Creatinine, Ser 1.61 0.61 - 1.24 mg/dL   Calcium 9.6 8.9 - 09.6 mg/dL   Total Protein 7.9 6.5 - 8.1 g/dL   Albumin 4.3 3.5 - 5.0 g/dL   AST 28 15 - 41 U/L   ALT 34 17 - 63 U/L   Alkaline Phosphatase 77 38 - 126 U/L   Total Bilirubin 0.9 0.3 - 1.2 mg/dL   GFR calc non Af Amer >60 >60 mL/min   GFR calc Af Amer >60 >60 mL/min   Anion gap 14 5 - 15  CBC  Result Value Ref Range   WBC 22.1 (H) 4.0 - 10.5 K/uL   RBC 4.98 4.22 - 5.81 MIL/uL   Hemoglobin 14.4 13.0 - 17.0 g/dL   HCT 04.5 40.9 - 81.1 %   MCV 87.1 78.0 - 100.0 fL   MCH 28.9 26.0 - 34.0 pg   MCHC 33.2 30.0 - 36.0 g/dL   RDW 91.4 78.2 - 95.6 %   Platelets 323 150 - 400 K/uL  Urinalysis, Routine w reflex microscopic (not at [facility])  Result Value Ref Range   Color, Urine YELLOW YELLOW   APPearance CLEAR CLEAR    Specific Gravity, Urine 1.021 1.005 - 1.030   pH 6.0 5.0 - 8.0   Glucose, UA NEGATIVE NEGATIVE mg/dL   Hgb urine dipstick NEGATIVE NEGATIVE   Bilirubin Urine NEGATIVE NEGATIVE   Ketones, ur >80 (A) NEGATIVE mg/dL   Protein, ur NEGATIVE NEGATIVE mg/dL   Nitrite NEGATIVE NEGATIVE   Leukocytes, UA NEGATIVE NEGATIVE  Troponin I  Result Value Ref Range   Troponin I <0.03 <0.031 ng/mL   Dg Chest 2 View  05/18/2015  CLINICAL DATA:  50 year old male with upper abdominal pain EXAM: CHEST  2 VIEW COMPARISON:  None. FINDINGS: The heart size and mediastinal contours are within normal limits. Both lungs are clear. The visualized skeletal structures are unremarkable. IMPRESSION: No active cardiopulmonary disease. Electronically Signed   By: Elgie Collard M.D.   On: 05/18/2015 00:27   Ct Abdomen Pelvis W Contrast  05/18/2015  CLINICAL DATA:  Acute onset  of upper abdominal pain, radiating to the back. Nausea, chills and diaphoresis. Recent syncope. Initial encounter. EXAM: CT ABDOMEN AND PELVIS WITH CONTRAST TECHNIQUE: Multidetector CT imaging of the abdomen and pelvis was performed using the standard protocol following bolus administration of intravenous contrast. CONTRAST:  OMNIPAQUE IOHEXOL 300 MG/ML  SOLN COMPARISON:  None. FINDINGS: The visualized lung bases are clear. The liver and spleen are unremarkable in appearance. There is suggestion of mild soft tissue edema about the gallbladder, which may reflect mild cholecystitis. Would correlate with the patient's symptoms. The pancreas and adrenal glands are unremarkable. The kidneys are unremarkable in appearance. There is no evidence of hydronephrosis. No renal or ureteral stones are seen. Nonspecific perinephric stranding is noted bilaterally. No free fluid is identified. The small bowel is unremarkable in appearance. The stomach is within normal limits. No acute vascular abnormalities are seen. Minimal calcification is noted along the distal  abdominal aorta. The appendix is normal in caliber, without evidence of appendicitis. The colon is largely decompressed and grossly unremarkable in appearance. The bladder is mildly distended and grossly unremarkable. The prostate remains normal in size. No inguinal lymphadenopathy is seen. No acute osseous abnormalities are identified. IMPRESSION: Suggestion of mild soft tissue edema about the gallbladder, which may reflect mild cholecystitis. Would correlate with the patient's symptoms. Electronically Signed   By: Roanna Raider M.D.   On: 05/18/2015 02:11   US Abdomen Limited  05/18/2015  CLINICAL DATA:  Acute onset of generalized abdominal pain. Initial encounter. EXAM: US ABDOMEN LIMITED - RIGHT UPPER QUADRANT COMPARISON:  CT of the abdomen and pelvis performed earlier today at 1:48 a.m. FINDINGS: Gallbladder: Scattered stones are noted dependently within the gallbladder. No pericholecystic fluid or gallbladder wall thickening is seen. No ultrasonographic Murphy's sign is elicited. Common bile duct: Diameter: 0.5 cm, within normal limits in caliber. Liver: No focal lesion identified. Within normal limits in parenchymal echogenicity. IMPRESSION: Cholelithiasis. Gallbladder otherwise unremarkable. No evidence for obstruction or cholecystitis. Electronically Signed   By: Roanna Raider M.D.   On: 05/18/2015 05:21      Antony Madura, PA-C 05/18/15 0454  Devoria Albe, MD 05/18/15 [phone]

## 2015-05-18 NOTE — ED Notes (Signed)
Pt arrived from [facility] via [facility]; pt A/Ox4, in no s/s apparent distress noted---- sent here for [place].

## 2015-05-18 NOTE — Anesthesia Procedure Notes (Signed)
Procedure Name: Intubation Date/Time: 05/18/2015 10:08 AM Performed by: Jarvis Newcomer A Pre-anesthesia Checklist: Patient identified, Timeout performed, Emergency Drugs available, Suction available and Patient being monitored Patient Re-evaluated:Patient Re-evaluated prior to inductionOxygen Delivery Method: Circle system utilized Preoxygenation: Pre-oxygenation with 100% oxygen Intubation Type: IV induction Ventilation: Mask ventilation without difficulty Laryngoscope Size: Mac and 4 Grade View: Grade I Tube type: Oral Tube size: 7.5 mm Number of attempts: 1 Airway Equipment and Method: Stylet Placement Confirmation: breath sounds checked- equal and bilateral,  ETT inserted through vocal cords under direct vision and positive ETCO2 Secured at: 22 cm Tube secured with: Tape Dental Injury: Teeth and Oropharynx as per pre-operative assessment

## 2015-05-19 ENCOUNTER — Encounter (HOSPITAL_COMMUNITY): Payer: Self-pay | Admitting: Surgery

## 2015-05-19 LAB — CBC
HCT: 38.9 % — ABNORMAL LOW (ref 39.0–52.0)
HEMOGLOBIN: 12.5 g/dL — AB (ref 13.0–17.0)
MCH: 29.4 pg (ref 26.0–34.0)
MCHC: 32.1 g/dL (ref 30.0–36.0)
MCV: 91.5 fL (ref 78.0–100.0)
PLATELETS: 287 10*3/uL (ref 150–400)
RBC: 4.25 MIL/uL (ref 4.22–5.81)
RDW: 13.4 % (ref 11.5–15.5)
WBC: 18.7 10*3/uL — AB (ref 4.0–10.5)

## 2015-05-19 MED ORDER — IBUPROFEN 200 MG PO TABS: 400.0000 mg | ORAL_TABLET | Freq: Three times a day (TID) | ORAL | Status: DC | PRN

## 2015-05-19 MED ORDER — HYDROCODONE-ACETAMINOPHEN 5-325 MG PO TABS: 1.0000 | ORAL_TABLET | ORAL | Status: DC | PRN

## 2015-05-19 NOTE — Discharge Summary (Signed)
Physician Discharge Summary  Patient ID: Jeffery Rivas MRN: [id number] DOB/AGE: 1965/09/28 50 y.o.  Admit date: 05/17/2015 Discharge date: 05/19/2015  Admitting Diagnosis: Cholecystitis and cholelithiasis   Discharge Diagnosis Patient Active Problem List   Diagnosis Date Noted  . Acute hemorrhagic cholecystitis s/p lap cholecystectomy 2/12/22017 05/18/2015  . Hypertension   . Anxiety   . GERD (gastroesophageal reflux disease)   . HLD (hyperlipidemia) 04/15/2015  . Adult BMI 30+ 04/15/2015  . Excess or deficiency of vitamin D 04/15/2015    Consultants none  Imaging: Dg Chest 2 View  05/18/2015  CLINICAL DATA:  50 year old male with upper abdominal pain EXAM: CHEST  2 VIEW COMPARISON:  None. FINDINGS: The heart size and mediastinal contours are within normal limits. Both lungs are clear. The visualized skeletal structures are unremarkable. IMPRESSION: No active cardiopulmonary disease. Electronically Signed   By: Elgie Collard M.D.   On: 05/18/2015 00:27   Dg Cholangiogram Operative  05/18/2015  CLINICAL DATA:  Mild cholecystitis EXAM: INTRAOPERATIVE CHOLANGIOGRAM TECHNIQUE: Cholangiographic images from the C-arm fluoroscopic device were submitted for interpretation post-operatively. Please see the procedural report for the amount of contrast and the fluoroscopy time utilized. FLUOROSCOPY TIME:  10 seconds COMPARISON:  CT abdomen pelvis dated 05/18/2015 FINDINGS: No filling defect within the common bile duct, which is nondilated. Intrahepatic biliary tree is also nondilated. Cholecystectomy clips.  No active extravasation of contrast. Contrast opacifies the duodenum. IMPRESSION: No evidence of choledocholithiasis. No intrahepatic or extrahepatic ductal dilatation. Electronically Signed   By: Charline Bills M.D.   On: 05/18/2015 12:46   Ct Abdomen Pelvis W Contrast  05/18/2015  CLINICAL DATA:  Acute onset of upper abdominal pain, radiating to the back. Nausea, chills and  diaphoresis. Recent syncope. Initial encounter. EXAM: CT ABDOMEN AND PELVIS WITH CONTRAST TECHNIQUE: Multidetector CT imaging of the abdomen and pelvis was performed using the standard protocol following bolus administration of intravenous contrast. CONTRAST:  OMNIPAQUE IOHEXOL 300 MG/ML  SOLN COMPARISON:  None. FINDINGS: The visualized lung bases are clear. The liver and spleen are unremarkable in appearance. There is suggestion of mild soft tissue edema about the gallbladder, which may reflect mild cholecystitis. Would correlate with the patient's symptoms. The pancreas and adrenal glands are unremarkable. The kidneys are unremarkable in appearance. There is no evidence of hydronephrosis. No renal or ureteral stones are seen. Nonspecific perinephric stranding is noted bilaterally. No free fluid is identified. The small bowel is unremarkable in appearance. The stomach is within normal limits. No acute vascular abnormalities are seen. Minimal calcification is noted along the distal abdominal aorta. The appendix is normal in caliber, without evidence of appendicitis. The colon is largely decompressed and grossly unremarkable in appearance. The bladder is mildly distended and grossly unremarkable. The prostate remains normal in size. No inguinal lymphadenopathy is seen. No acute osseous abnormalities are identified. IMPRESSION: Suggestion of mild soft tissue edema about the gallbladder, which may reflect mild cholecystitis. Would correlate with the patient's symptoms. Electronically Signed   By: Roanna Raider M.D.   On: 05/18/2015 02:11   US Abdomen Limited  05/18/2015  CLINICAL DATA:  Acute onset of generalized abdominal pain. Initial encounter. EXAM: US ABDOMEN LIMITED - RIGHT UPPER QUADRANT COMPARISON:  CT of the abdomen and pelvis performed earlier today at 1:48 a.m. FINDINGS: Gallbladder: Scattered stones are noted dependently within the gallbladder. No pericholecystic fluid or gallbladder wall  thickening is seen. No ultrasonographic Murphy's sign is elicited. Common bile duct: Diameter: 0.5 cm, within normal limits in caliber. Liver:  No focal lesion identified. Within normal limits in parenchymal echogenicity. IMPRESSION: Cholelithiasis. Gallbladder otherwise unremarkable. No evidence for obstruction or cholecystitis. Electronically Signed   By: Roanna Raider M.D.   On: 05/18/2015 05:21    Procedures Laparoscopic cholecystectomy with IOC---Dr. [facility] Course:  Jeffery Rivas is a 50 year old male with a history of HTN, GERD and anxiety who presented to [facility] with abdominal pain.  CT scan abdomen - 05/18/2015 - Suggestion of mild soft tissue edema about the gallbladder, which may reflect mild cholecystitis.  [place] of abdomen - 05/18/2015 - gallstones Patient was admitted and underwent procedure listed above.  Tolerated procedure well and was transferred to the floor.  Diet was advanced as tolerated.  On POD#1, the patient was voiding well, tolerating diet, ambulating well, pain well controlled, vital signs stable, incisions c/d/i and felt stable for discharge home.  Medication risks, benefits and therapeutic alternatives were reviewed with the patient.  He verbalizes understanding.  Patient will follow up in our office in 2 weeks and knows to call with questions or concerns.  Physical Exam: General:  Alert, NAD, pleasant, comfortable Abd:  Soft, ND, mild tenderness, incisions C/D/I    Medication List    TAKE these medications        amLODipine 5 MG tablet  Commonly known as:  NORVASC  Take 5 mg by mouth daily.     aspirin 81 MG tablet  Take 81 mg by mouth daily.     cholecalciferol 1000 units tablet  Commonly known as:  VITAMIN D  Take 1,000 Units by mouth daily.     citalopram 10 MG tablet  Commonly known as:  CELEXA  Take 10 mg by mouth daily.     enalapril 10 MG tablet  Commonly known as:  VASOTEC  Take 10 mg by mouth 2 (two) times daily.      HYDROcodone-acetaminophen 5-325 MG tablet  Commonly known as:  NORCO/VICODIN  Take 1-2 tablets by mouth every 4 (four) hours as needed for moderate pain.     ibuprofen 200 MG tablet  Commonly known as:  ADVIL,MOTRIN  Take 2 tablets (400 mg total) by mouth every 8 (eight) hours as needed for mild pain.     loratadine 10 MG tablet  Commonly known as:  CLARITIN  Take 10 mg by mouth daily.     omeprazole 40 MG capsule  Commonly known as:  PRILOSEC  Take 40 mg by mouth daily.             Follow-up Information    Follow up with [facility] On 06/11/2015.   Specialty:  General Surgery   Why:  arriveby 8:45AM for a 9:15AM post op check    Contact information:   [address] [place] [address] [phone]       Signed: Ashok Norris, [facility] [phone]  05/19/2015, 8:39 AM

## 2015-05-19 NOTE — Discharge Instructions (Signed)

## 2015-05-19 NOTE — Progress Notes (Signed)
Discharge instructions reviewed with patient and spouse. Questions answered. Prescription and work note given to patient. Lina Sar, RN 05/19/15 1134.

## 2015-05-19 NOTE — Care Management Note (Signed)
Case Management Note  Patient Details  Name: Eligh Rybacki MRN: [id number] Date of Birth: 04/14/1965  Subjective/Objective:  Admitted with Cholecystitis and cholelithiasis, s/p lap chole                  Action/Plan: Discharge planning, no HH needs identified  Expected Discharge Date:                  Expected Discharge Plan:  Home/Self Care  In-House Referral:  NA  Discharge planning Services  CM Consult  Post Acute Care Choice:  NA Choice offered to:  NA  DME Arranged:  N/A DME Agency:  NA  HH Arranged:  NA HH Agency:  NA  Status of Service:  Completed, signed off  Medicare Important Message Given:    Date Medicare IM Given:    Medicare IM give by:    Date Additional Medicare IM Given:    Additional Medicare Important Message give by:     If discussed at Long Length of Stay Meetings, dates discussed:    Additional Comments:  Alexis Goodell, RN 05/19/2015, 9:38 AM [phone]

## 2017-02-03 IMAGING — RF DG CHOLANGIOGRAM OPERATIVE
1 series · 4 of 4 positions shown · non-contrast
Comparison: CT abdomen pelvis dated 05/18/2015

CLINICAL DATA: Mild cholecystitis

EXAM:
INTRAOPERATIVE CHOLANGIOGRAM
TECHNIQUE: Cholangiographic images from the C-arm fluoroscopic device were
submitted for interpretation post-operatively. Please see the
procedural report for the amount of contrast and the fluoroscopy
time utilized.
FLUOROSCOPY TIME:  10 seconds

[Series 1: run · 4 of 59 frames shown]
[frame 9/59]
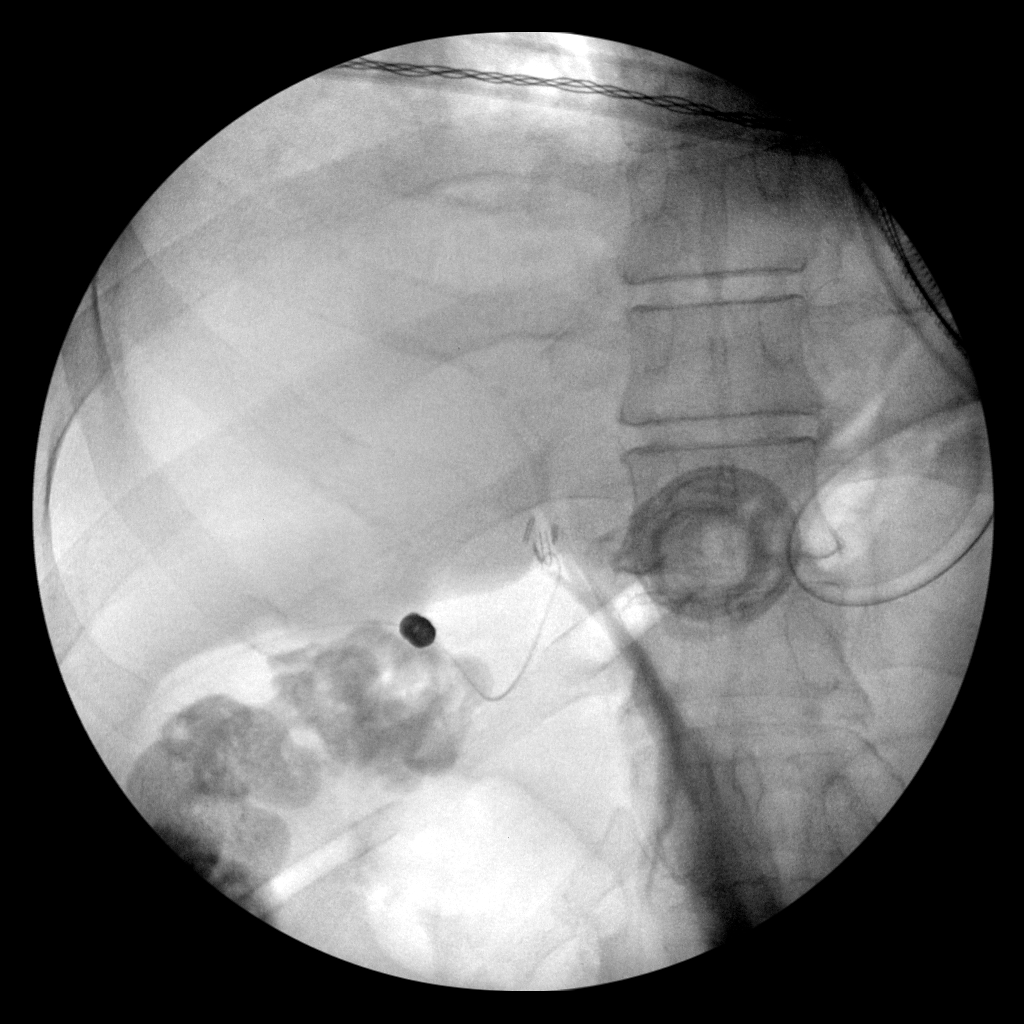
[frame 30/59]
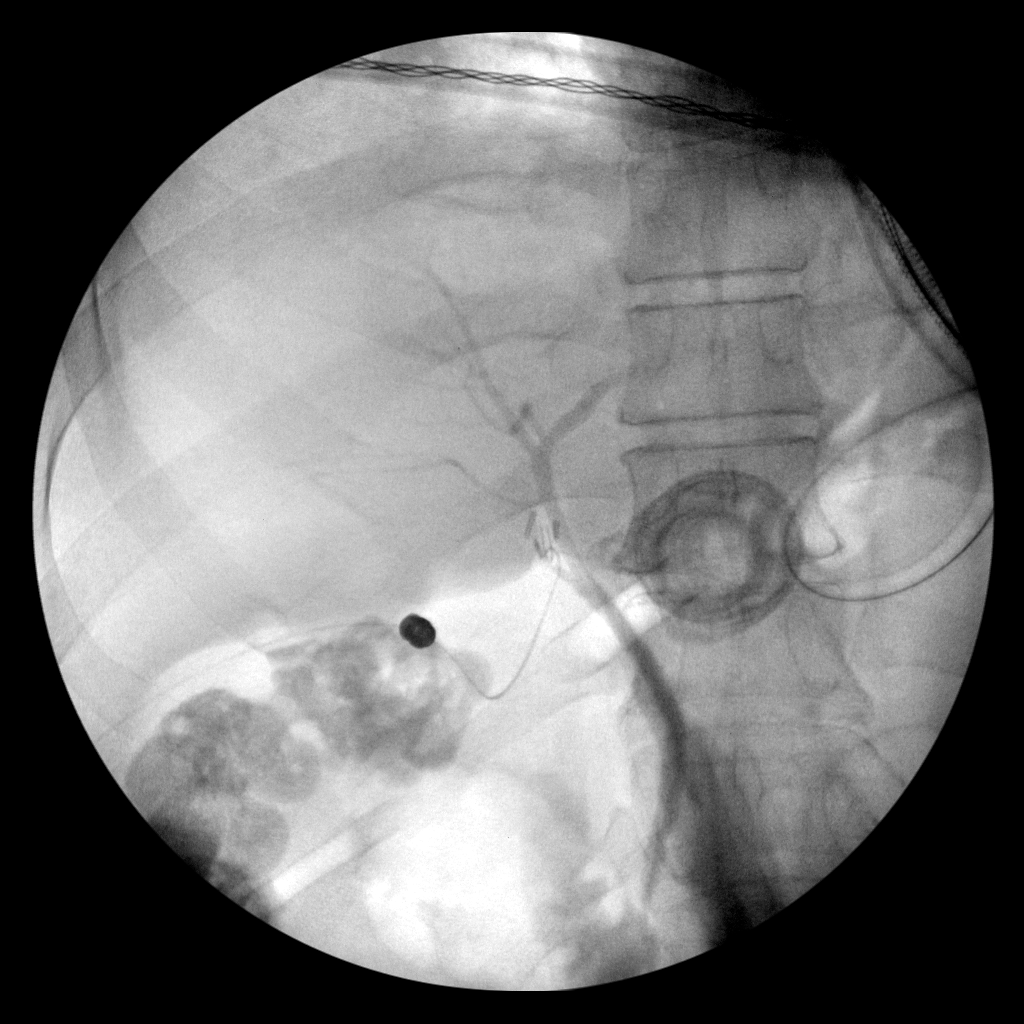
[frame 51/59]
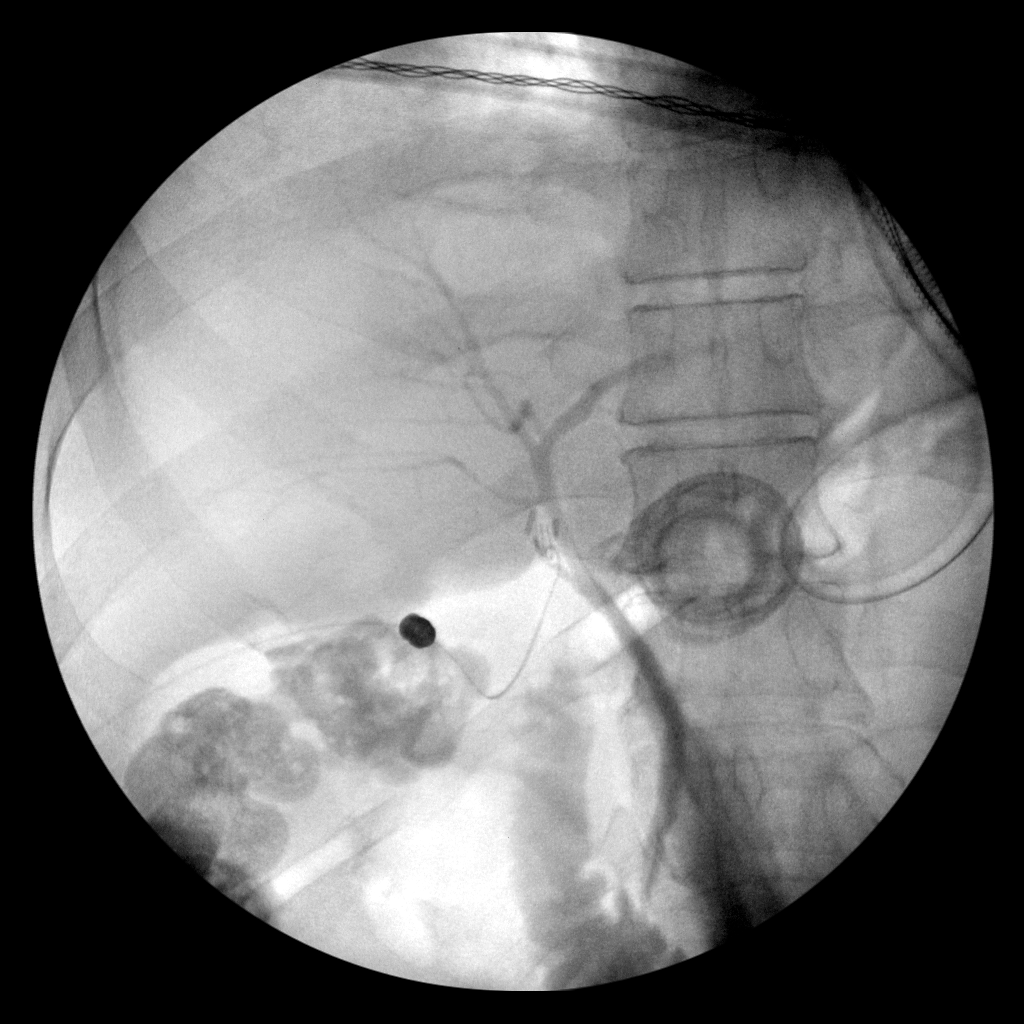
[frame 58/59]
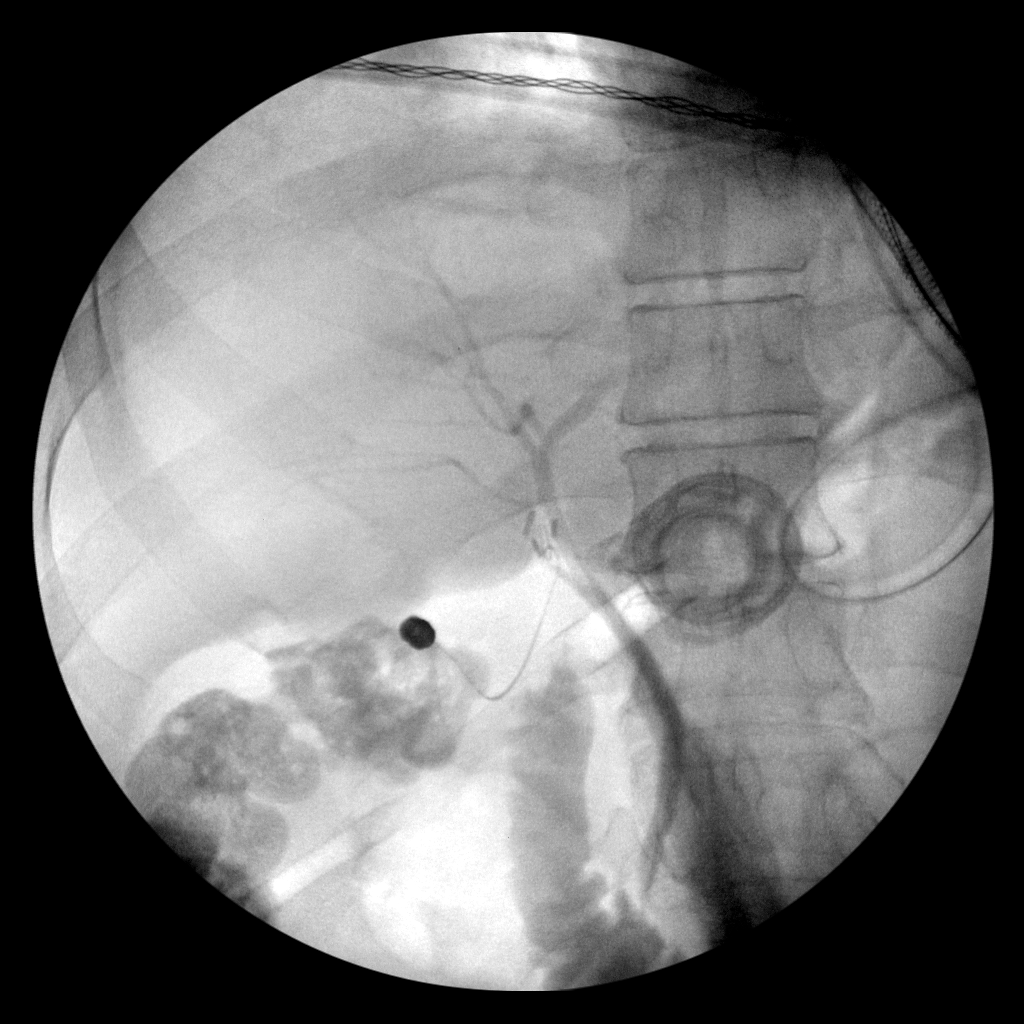

[4 of 4 positions shown; findings below may reference images not displayed]

FINDINGS: No filling defect within the common bile duct, which is nondilated.

Intrahepatic biliary tree is also nondilated.

Cholecystectomy clips.  No active extravasation of contrast.

Contrast opacifies the duodenum.
IMPRESSION: No evidence of choledocholithiasis.

No intrahepatic or extrahepatic ductal dilatation.

## 2017-02-03 IMAGING — CR DG CHEST 2V
2 series · 2 of 2 positions shown · non-contrast
Comparison: None.

CLINICAL DATA: 49-year-old male with upper abdominal pain

EXAM:
CHEST  2 VIEW

[w chest pa]
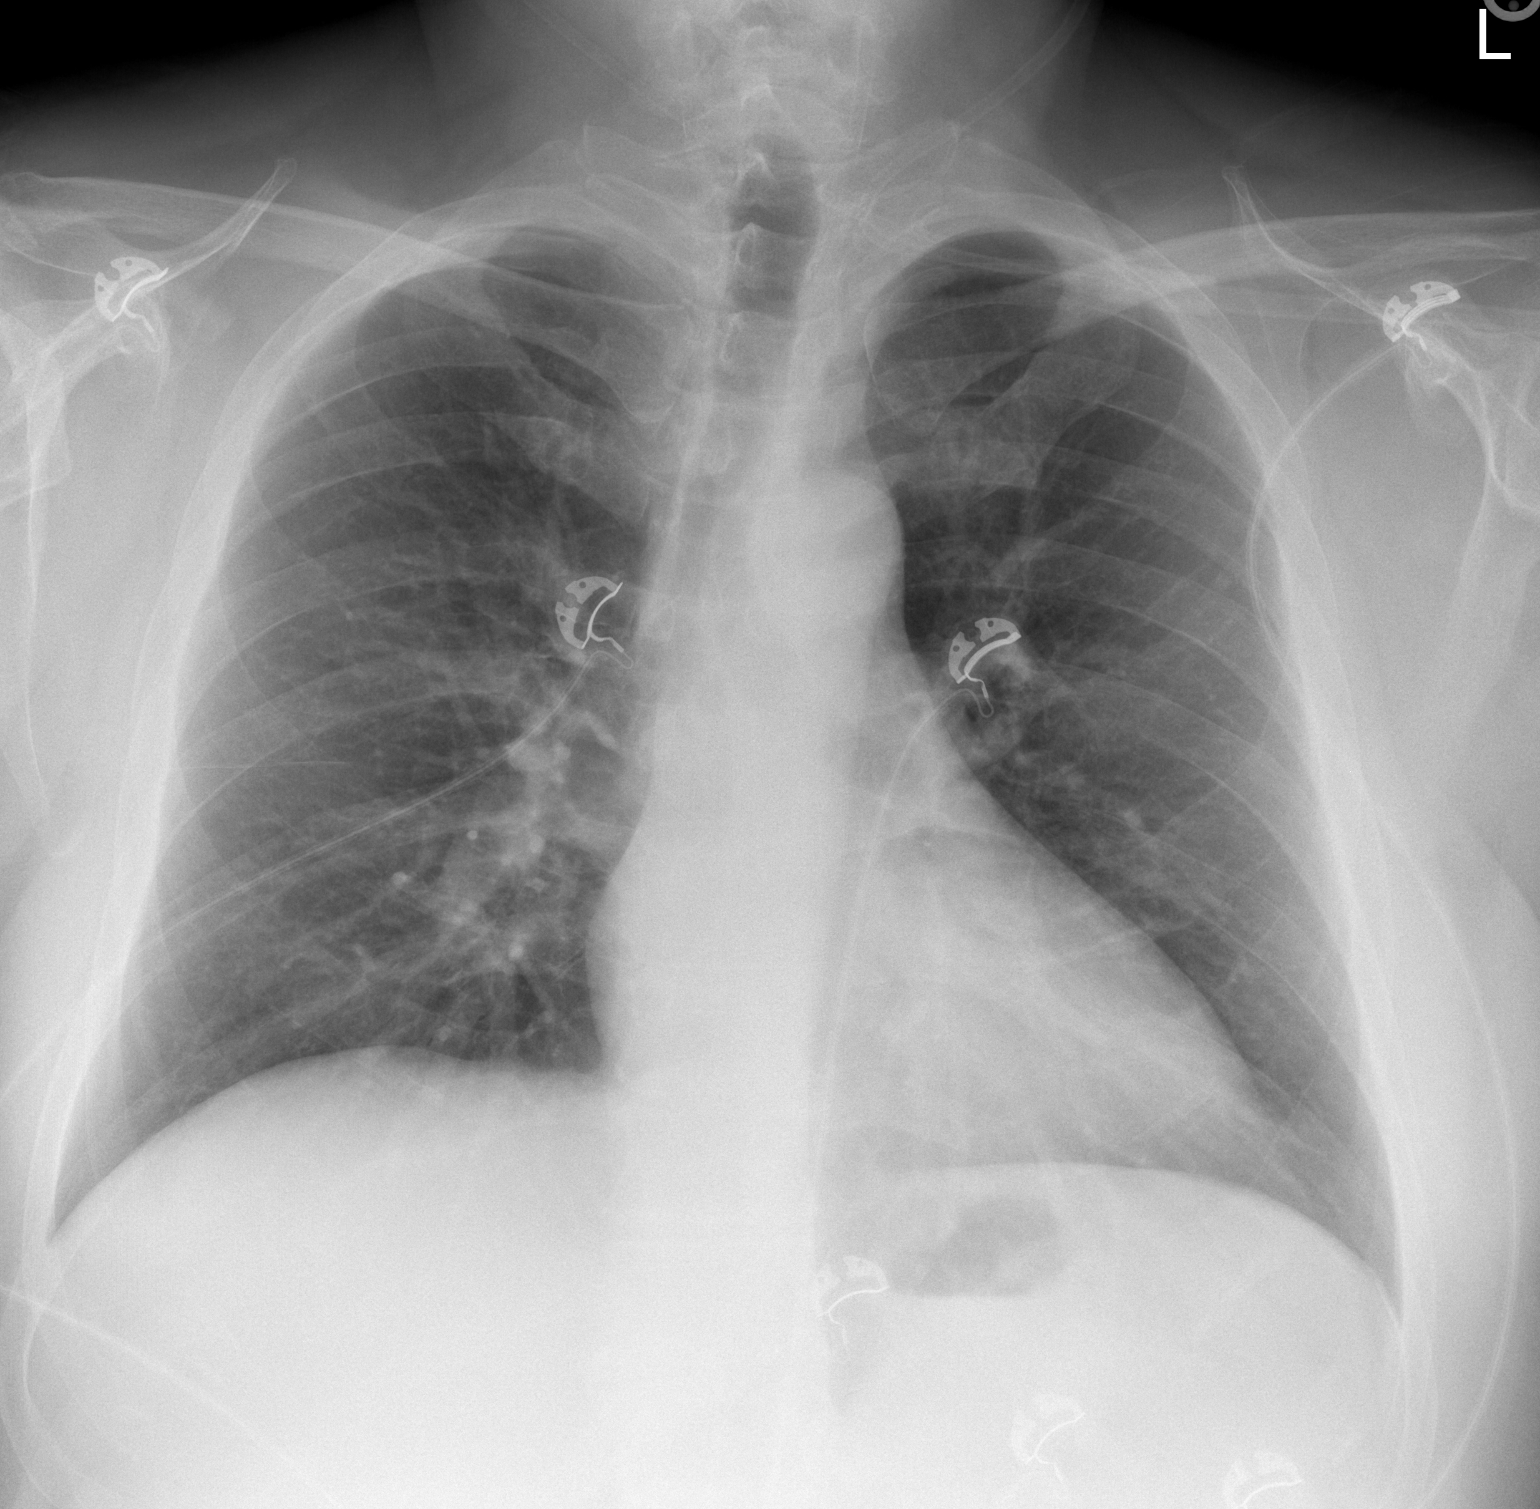

[w chest lat]
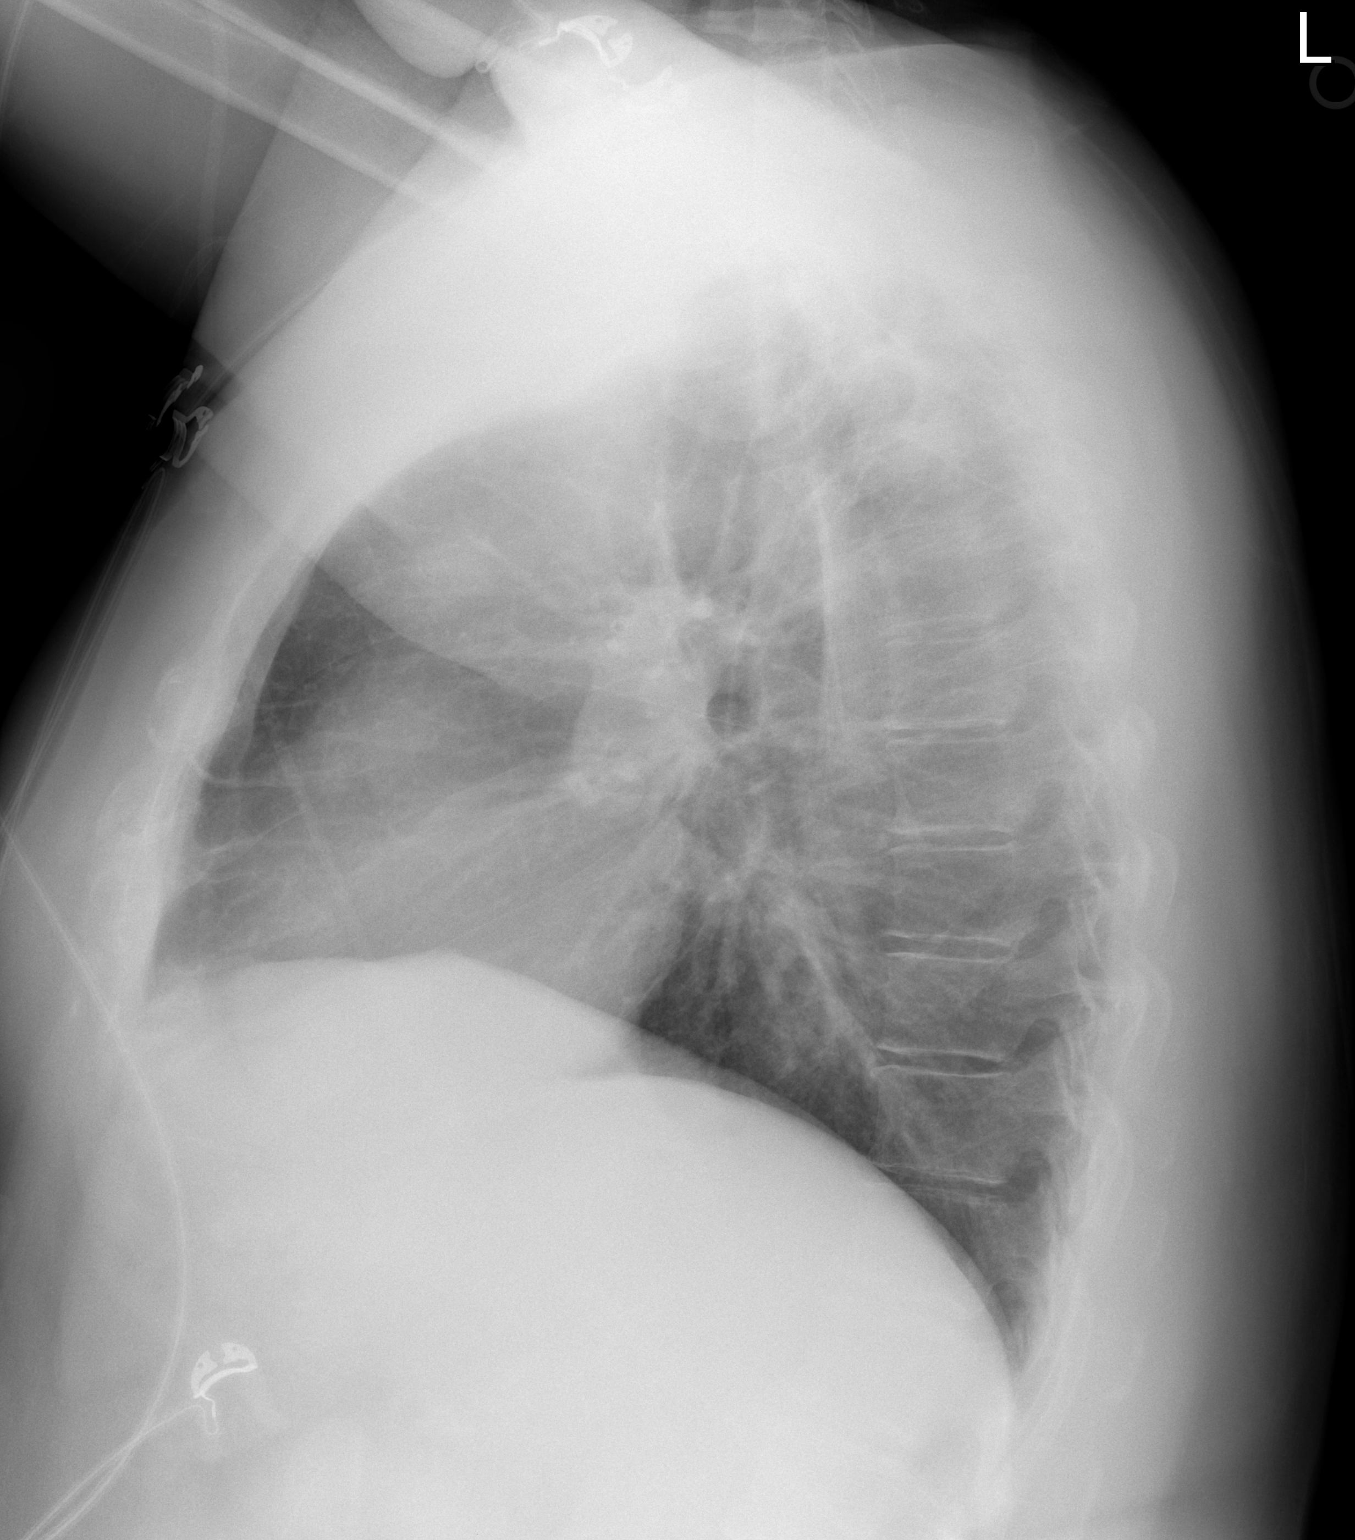

[2 of 2 positions shown; findings below may reference images not displayed]

FINDINGS: The heart size and mediastinal contours are within normal limits.
Both lungs are clear. The visualized skeletal structures are
unremarkable.
IMPRESSION: No active cardiopulmonary disease.

## 2019-09-14 DIAGNOSIS — E669 Obesity, unspecified: Secondary | ICD-10-CM | POA: Diagnosis not present

## 2019-09-14 DIAGNOSIS — I10 Essential (primary) hypertension: Secondary | ICD-10-CM | POA: Diagnosis not present

## 2019-09-14 DIAGNOSIS — F419 Anxiety disorder, unspecified: Secondary | ICD-10-CM | POA: Diagnosis not present

## 2020-03-14 DIAGNOSIS — Z79899 Other long term (current) drug therapy: Secondary | ICD-10-CM | POA: Diagnosis not present

## 2020-03-14 DIAGNOSIS — Z130 Encounter for screening for diseases of the blood and blood-forming organs and certain disorders involving the immune mechanism: Secondary | ICD-10-CM | POA: Diagnosis not present

## 2020-03-14 DIAGNOSIS — G43909 Migraine, unspecified, not intractable, without status migrainosus: Secondary | ICD-10-CM | POA: Diagnosis not present

## 2020-03-14 DIAGNOSIS — Z0001 Encounter for general adult medical examination with abnormal findings: Secondary | ICD-10-CM | POA: Diagnosis not present

## 2020-03-14 DIAGNOSIS — E785 Hyperlipidemia, unspecified: Secondary | ICD-10-CM | POA: Diagnosis not present

## 2020-03-14 DIAGNOSIS — K219 Gastro-esophageal reflux disease without esophagitis: Secondary | ICD-10-CM | POA: Diagnosis not present

## 2020-03-14 DIAGNOSIS — Z8249 Family history of ischemic heart disease and other diseases of the circulatory system: Secondary | ICD-10-CM | POA: Diagnosis not present

## 2020-03-14 DIAGNOSIS — M549 Dorsalgia, unspecified: Secondary | ICD-10-CM | POA: Diagnosis not present

## 2020-03-14 DIAGNOSIS — I10 Essential (primary) hypertension: Secondary | ICD-10-CM | POA: Diagnosis not present

## 2020-03-14 DIAGNOSIS — Z833 Family history of diabetes mellitus: Secondary | ICD-10-CM | POA: Diagnosis not present

## 2020-03-14 DIAGNOSIS — E559 Vitamin D deficiency, unspecified: Secondary | ICD-10-CM | POA: Diagnosis not present

## 2020-03-14 DIAGNOSIS — Z1389 Encounter for screening for other disorder: Secondary | ICD-10-CM | POA: Diagnosis not present

## 2020-03-14 DIAGNOSIS — F419 Anxiety disorder, unspecified: Secondary | ICD-10-CM | POA: Diagnosis not present

## 2020-03-14 DIAGNOSIS — M62830 Muscle spasm of back: Secondary | ICD-10-CM | POA: Diagnosis not present

## 2020-03-17 DIAGNOSIS — E785 Hyperlipidemia, unspecified: Secondary | ICD-10-CM | POA: Diagnosis not present

## 2020-03-17 DIAGNOSIS — I10 Essential (primary) hypertension: Secondary | ICD-10-CM | POA: Diagnosis not present

## 2020-03-17 DIAGNOSIS — Z0000 Encounter for general adult medical examination without abnormal findings: Secondary | ICD-10-CM | POA: Diagnosis not present

## 2020-03-17 DIAGNOSIS — E559 Vitamin D deficiency, unspecified: Secondary | ICD-10-CM | POA: Diagnosis not present

## 2020-03-17 DIAGNOSIS — G43909 Migraine, unspecified, not intractable, without status migrainosus: Secondary | ICD-10-CM | POA: Diagnosis not present

## 2020-09-16 DIAGNOSIS — F419 Anxiety disorder, unspecified: Secondary | ICD-10-CM | POA: Diagnosis not present

## 2020-09-16 DIAGNOSIS — I10 Essential (primary) hypertension: Secondary | ICD-10-CM | POA: Diagnosis not present

## 2021-03-18 DIAGNOSIS — I10 Essential (primary) hypertension: Secondary | ICD-10-CM | POA: Diagnosis not present

## 2021-03-18 DIAGNOSIS — G43909 Migraine, unspecified, not intractable, without status migrainosus: Secondary | ICD-10-CM | POA: Diagnosis not present

## 2021-03-18 DIAGNOSIS — K219 Gastro-esophageal reflux disease without esophagitis: Secondary | ICD-10-CM | POA: Diagnosis not present

## 2021-03-18 DIAGNOSIS — Z79899 Other long term (current) drug therapy: Secondary | ICD-10-CM | POA: Diagnosis not present

## 2021-03-18 DIAGNOSIS — Z833 Family history of diabetes mellitus: Secondary | ICD-10-CM | POA: Diagnosis not present

## 2021-03-18 DIAGNOSIS — Z13228 Encounter for screening for other metabolic disorders: Secondary | ICD-10-CM | POA: Diagnosis not present

## 2021-03-18 DIAGNOSIS — Z0000 Encounter for general adult medical examination without abnormal findings: Secondary | ICD-10-CM | POA: Diagnosis not present

## 2021-03-18 DIAGNOSIS — N401 Enlarged prostate with lower urinary tract symptoms: Secondary | ICD-10-CM | POA: Diagnosis not present

## 2021-03-18 DIAGNOSIS — Z125 Encounter for screening for malignant neoplasm of prostate: Secondary | ICD-10-CM | POA: Diagnosis not present

## 2021-03-18 DIAGNOSIS — Z8249 Family history of ischemic heart disease and other diseases of the circulatory system: Secondary | ICD-10-CM | POA: Diagnosis not present

## 2021-03-18 DIAGNOSIS — M545 Low back pain, unspecified: Secondary | ICD-10-CM | POA: Diagnosis not present

## 2021-03-18 DIAGNOSIS — Z7982 Long term (current) use of aspirin: Secondary | ICD-10-CM | POA: Diagnosis not present

## 2021-03-18 DIAGNOSIS — E559 Vitamin D deficiency, unspecified: Secondary | ICD-10-CM | POA: Diagnosis not present

## 2021-03-18 DIAGNOSIS — Z13220 Encounter for screening for lipoid disorders: Secondary | ICD-10-CM | POA: Diagnosis not present

## 2021-03-18 DIAGNOSIS — F419 Anxiety disorder, unspecified: Secondary | ICD-10-CM | POA: Diagnosis not present

## 2021-03-18 DIAGNOSIS — R350 Frequency of micturition: Secondary | ICD-10-CM | POA: Diagnosis not present

## 2021-06-15 ENCOUNTER — Ambulatory Visit (INDEPENDENT_AMBULATORY_CARE_PROVIDER_SITE_OTHER): Payer: BLUE CROSS/BLUE SHIELD | Admitting: Family Medicine

## 2021-06-15 ENCOUNTER — Other Ambulatory Visit: Payer: Self-pay

## 2021-06-15 ENCOUNTER — Encounter: Payer: Self-pay | Admitting: Family Medicine

## 2021-06-15 VITALS — BP 148/92 | HR 84 | Temp 98.0°F | Ht 68.0 in | Wt 190.8 lb

## 2021-06-15 DIAGNOSIS — K219 Gastro-esophageal reflux disease without esophagitis: Secondary | ICD-10-CM

## 2021-06-15 DIAGNOSIS — R972 Elevated prostate specific antigen [PSA]: Secondary | ICD-10-CM

## 2021-06-15 DIAGNOSIS — F419 Anxiety disorder, unspecified: Secondary | ICD-10-CM

## 2021-06-15 DIAGNOSIS — I10 Essential (primary) hypertension: Secondary | ICD-10-CM

## 2021-06-15 MED ORDER — FAMOTIDINE 20 MG PO TABS: 20.0000 mg | ORAL_TABLET | Freq: Every day | ORAL | 1 refills | Status: DC

## 2021-06-15 MED ORDER — AMLODIPINE BESYLATE 5 MG PO TABS: 5.0000 mg | ORAL_TABLET | Freq: Every day | ORAL | 1 refills | Status: DC

## 2021-06-15 MED ORDER — ENALAPRIL MALEATE 10 MG PO TABS: 10.0000 mg | ORAL_TABLET | Freq: Two times a day (BID) | ORAL | 1 refills | Status: DC

## 2021-06-15 MED ORDER — CITALOPRAM HYDROBROMIDE 10 MG PO TABS: 10.0000 mg | ORAL_TABLET | Freq: Every day | ORAL | 1 refills | Status: DC

## 2021-06-15 NOTE — Progress Notes (Signed)
? ?New Patient Office Visit ? ?Subjective:  ?Patient ID: Jeffery Rivas, male    DOB: [birth year]  Age: 56 y.o. MRN: [id number] ? ?CC:  ?Chief Complaint  ?Patient presents with  ? Establish Care  ?  NP/ establish care, no concerns. Refill on BP med. Not fasting.   ? ? ?HPI ?Jeffery Rivas presents for establishment of care follow-up on hypertension, anxiety and GERD.  GERD has been treated with Pepcid.  Anxiety treated with citalopram.  Blood pressure has been treated with amlodipine 5 mg and Vasotec 10 mg twice daily.  Blood pressure at home has been running in the upper 130s over 80s.  Patient denies significant issues or changes with urine flow, frequency urgency or nocturia.  No family history of prostate disease.  He is tolerating the medicines above well.  He works as a Psychiatric nursenetwork engineer. ? ?Past Medical History:  ?Diagnosis Date  ? Anxiety   ? GERD (gastroesophageal reflux disease)   ? Hypertension   ? ? ?Past Surgical History:  ?Procedure Laterality Date  ? CHOLECYSTECTOMY N/A 05/18/2015  ? Procedure: LAPAROSCOPIC CHOLECYSTECTOMY WITH INTRAOPERATIVE CHOLANGIOGRAM;  Surgeon: Ovidio Kinavid Newman, MD;  Location: [facility];  Service: General;  Laterality: N/A;  ? TONSILLECTOMY    ? ? ?Family History  ?Problem Relation Age of Onset  ? COPD Mother   ? Healthy Father   ? ? ?Social History  ? ?Socioeconomic History  ? Marital status: Married  ?  Spouse name: Not on file  ? Number of children: Not on file  ? Years of education: Not on file  ? Highest education level: Not on file  ?Occupational History  ? Not on file  ?Tobacco Use  ? Smoking status: Never  ? Smokeless tobacco: Never  ?Vaping Use  ? Vaping Use: Never used  ?Substance and Sexual Activity  ? Alcohol use: Yes  ?  Comment: rare 2 beers a year  ? Drug use: No  ? Sexual activity: Yes  ?Other Topics Concern  ? Not on file  ?Social History Narrative  ? Not on file  ? ?Social Determinants of Health  ? ?Financial Resource Strain: Not on file  ?Food Insecurity: Not on file   ?Transportation Needs: Not on file  ?Physical Activity: Not on file  ?Stress: Not on file  ?Social Connections: Not on file  ?Intimate Partner Violence: Not on file  ? ? ?ROS ?Review of Systems  ?Constitutional:  Negative for chills, diaphoresis, fatigue, fever and unexpected weight change.  ?HENT: Negative.    ?Eyes:  Negative for photophobia and visual disturbance.  ?Respiratory: Negative.    ?Cardiovascular: Negative.   ?Gastrointestinal: Negative.   ?Endocrine: Negative for polyphagia and polyuria.  ?Genitourinary:  Negative for difficulty urinating, frequency and urgency.  ?Neurological:  Negative for speech difficulty, weakness and light-headedness.  ? ?Objective:  ? ?Today's Vitals: BP (!) 148/92 (BP Location: Left Arm, Patient Position: Sitting, Cuff Size: Normal)   Pulse 84   Temp 98 ?F (36.7 ?C) (Temporal)   Ht 5\' 8"  (1.727 m)   Wt 190 lb 12.8 oz (86.5 kg)   SpO2 98%   BMI 29.01 kg/m?  ? ?Physical Exam ?Vitals and nursing note reviewed.  ?Constitutional:   ?   General: He is not in acute distress. ?   Appearance: Normal appearance. He is not ill-appearing, toxic-appearing or diaphoretic.  ?HENT:  ?   Head: Normocephalic and atraumatic.  ?   Right Ear: Tympanic membrane, ear canal and external ear normal.  ?  Left Ear: Tympanic membrane, ear canal and external ear normal.  ?   Mouth/Throat:  ?   Mouth: Mucous membranes are moist.  ?   Pharynx: Oropharynx is clear. No oropharyngeal exudate or posterior oropharyngeal erythema.  ?Eyes:  ?   General: No scleral icterus.    ?   Right eye: No discharge.     ?   Left eye: No discharge.  ?   Extraocular Movements: Extraocular movements intact.  ?   Conjunctiva/sclera: Conjunctivae normal.  ?   Pupils: Pupils are equal, round, and reactive to light.  ?Neck:  ?   Vascular: No carotid bruit.  ?Cardiovascular:  ?   Rate and Rhythm: Normal rate and regular rhythm.  ?Pulmonary:  ?   Effort: Pulmonary effort is normal.  ?   Breath sounds: Normal breath sounds.   ?Musculoskeletal:  ?   Cervical back: No rigidity or tenderness.  ?Lymphadenopathy:  ?   Cervical: No cervical adenopathy.  ?Skin: ?   General: Skin is warm and dry.  ?Neurological:  ?   Mental Status: He is alert and oriented to person, place, and time.  ?Psychiatric:     ?   Mood and Affect: Mood normal.     ?   Behavior: Behavior normal.  ? ? ?Assessment & Plan:  ? ?Problem List Items Addressed This Visit   ? ?  ? Cardiovascular and Mediastinum  ? Hypertension - Primary  ? Relevant Medications  ? amLODipine (NORVASC) 5 MG tablet  ? enalapril (VASOTEC) 10 MG tablet  ?  ? Digestive  ? GERD (gastroesophageal reflux disease)  ? Relevant Medications  ? famotidine (PEPCID) 20 MG tablet  ?  ? Other  ? Anxiety  ? Relevant Medications  ? citalopram (CELEXA) 10 MG tablet  ? Elevated PSA, less than 10 ng/ml  ? ? ?Outpatient Encounter Medications as of 06/15/2021  ?Medication Sig  ? aspirin 81 MG tablet Take 81 mg by mouth daily.  ? cetirizine (ZYRTEC) 10 MG tablet Take 10 mg by mouth daily.  ? cholecalciferol (VITAMIN D) 1000 units tablet Take 1,000 Units by mouth daily.  ? cyclobenzaprine (FLEXERIL) 10 MG tablet Take by mouth.  ? ibuprofen (ADVIL,MOTRIN) 200 MG tablet Take 2 tablets (400 mg total) by mouth every 8 (eight) hours as needed for mild pain.  ? ketorolac (TORADOL) 10 MG tablet Take by mouth.  ? Melatonin 3 MG CAPS Take by mouth.  ? promethazine (PHENERGAN) 25 MG tablet Take by mouth.  ? rizatriptan (MAXALT) 10 MG tablet TAKE ONE TABLET BY MOUTH AS NEEDED FOR MIGRAINE  ? [DISCONTINUED] amLODipine (NORVASC) 5 MG tablet Take 5 mg by mouth daily.  ? [DISCONTINUED] citalopram (CELEXA) 10 MG tablet Take 10 mg by mouth daily.  ? [DISCONTINUED] enalapril (VASOTEC) 10 MG tablet Take 10 mg by mouth 2 (two) times daily.   ? [DISCONTINUED] famotidine (PEPCID) 20 MG tablet Take 20 mg by mouth daily.  ? amLODipine (NORVASC) 5 MG tablet Take 1 tablet (5 mg total) by mouth daily.  ? citalopram (CELEXA) 10 MG tablet Take 1  tablet (10 mg total) by mouth daily.  ? enalapril (VASOTEC) 10 MG tablet Take 1 tablet (10 mg total) by mouth 2 (two) times daily.  ? famotidine (PEPCID) 20 MG tablet Take 1 tablet (20 mg total) by mouth daily.  ? [DISCONTINUED] HYDROcodone-acetaminophen (NORCO/VICODIN) 5-325 MG tablet Take 1-2 tablets by mouth every 4 (four) hours as needed for moderate pain.  ? [DISCONTINUED] loratadine (CLARITIN) 10  MG tablet Take 10 mg by mouth daily.  ? [DISCONTINUED] omeprazole (PRILOSEC) 40 MG capsule Take 40 mg by mouth daily.  ? ?No facility-administered encounter medications on file as of 06/15/2021.  ? ? ?Follow-up: Return in about 3 months (around 09/15/2021).  Patient will check and record his blood pressures periodically over the next 3 months.  Discussed our goal for him would be the lower 130s over lower 80s.  We will recheck his PSA at that time as well.  Continue all medicines as above for now. ? ?Mliss Sax, MD ? ?

## 2021-06-16 ENCOUNTER — Telehealth: Payer: Self-pay | Admitting: Family Medicine

## 2021-06-16 DIAGNOSIS — I10 Essential (primary) hypertension: Secondary | ICD-10-CM

## 2021-06-16 MED ORDER — ENALAPRIL MALEATE 20 MG PO TABS: 20.0000 mg | ORAL_TABLET | Freq: Every day | ORAL | 1 refills | Status: DC

## 2021-06-16 NOTE — Telephone Encounter (Signed)
Pt's Is wanting his  Enalapril 10mg  to be resent in over to  ? [facility] [id number], [place] - [address]  ?[address] [place], [place]  ?Phone:  [phone]  Fax:  [phone] as  ? ?Enalapril 20mg , the other is double the price.  ?

## 2021-06-16 NOTE — Telephone Encounter (Signed)
Spoke with patients wife who states that patient was originally on Enalapril 20mg  they went to the pharmacy to pick up the 10mg  and the copay was tripled would like Rx for 20mg  sent in. Please advise. Okay to sent in requested dose?  ?

## 2021-06-16 NOTE — Telephone Encounter (Signed)
Rx sent in

## 2021-07-16 ENCOUNTER — Telehealth: Payer: Self-pay | Admitting: Family Medicine

## 2021-07-16 DIAGNOSIS — G43901 Migraine, unspecified, not intractable, with status migrainosus: Secondary | ICD-10-CM

## 2021-07-16 MED ORDER — RIZATRIPTAN BENZOATE 10 MG PO TABS: ORAL_TABLET | ORAL | 0 refills | Status: DC

## 2021-07-16 NOTE — Telephone Encounter (Signed)
Caller Name: Deveyon, Nazari ? ?Call back phone #: [phone] ? ?MEDICATION(S): rizatriptan (MAXALT) 10 MG tablet [id number]  ? ? ?Days of Med Remaining:  ? ?Has the patient contacted their pharmacy (YES/NO)?  yes ?IF YES, when and what did the pharmacy advise? Contact your pcp ?IF NO, request that the patient contact the pharmacy for the refills in the future.  ?           The pharmacy will send an electronic request (except for controlled medications). ? ?Preferred Pharmacy: [id number] - [address], [address]  ?[place], [place] [address]  ?Phone:  [phone]  Fax:  [phone] ? ?~~~Please advise patient/caregiver to allow 2-3 business days to process RX refills. ? ?

## 2021-09-16 ENCOUNTER — Ambulatory Visit: Payer: BLUE CROSS/BLUE SHIELD | Admitting: Family Medicine

## 2021-09-16 ENCOUNTER — Encounter: Payer: Self-pay | Admitting: Family Medicine

## 2021-09-16 VITALS — BP 148/92 | HR 86 | Temp 98.2°F | Ht 68.0 in | Wt 196.4 lb

## 2021-09-16 DIAGNOSIS — I10 Essential (primary) hypertension: Secondary | ICD-10-CM | POA: Diagnosis not present

## 2021-09-16 DIAGNOSIS — R972 Elevated prostate specific antigen [PSA]: Secondary | ICD-10-CM

## 2021-09-16 LAB — BASIC METABOLIC PANEL
BUN: 14 mg/dL (ref 6–23)
CO2: 29 mEq/L (ref 19–32)
Calcium: 9.8 mg/dL (ref 8.4–10.5)
Chloride: 106 mEq/L (ref 96–112)
Creatinine, Ser: 0.98 mg/dL (ref 0.40–1.50)
GFR: 85.71 mL/min (ref >60.00)
Glucose, Bld: 91 mg/dL (ref 70–99)
Potassium: 4.4 mEq/L (ref 3.5–5.1)
Sodium: 142 mEq/L (ref 135–145)

## 2021-09-16 LAB — PSA: PSA: 5.09 ng/mL — ABNORMAL HIGH (ref 0.10–4.00)

## 2021-09-16 MED ORDER — AMLODIPINE BESYLATE 10 MG PO TABS: 10.0000 mg | ORAL_TABLET | Freq: Every day | ORAL | 2 refills | Status: DC

## 2021-09-16 NOTE — Progress Notes (Addendum)
Established Patient Office Visit  Subjective   Patient ID: Jeffery Rivas, male    DOB: [birth year]  Age: 56 y.o. MRN: [id number]  Chief Complaint  Patient presents with   Follow-up    3 month follow up, no concerns. Patient not fasting.     HPI for follow-up of hypertension and elevated PSA.  Urine flow generally speaking is good.  There is some hesitancy with the first urine flow in the morning.  Otherwise there are no issues.  Denies nocturia urgency or frequency.  Blood pressure at home has been running in the 130s over 80-90.  Has been spending more time closer to 98 than 80.  There is no lower extremity edema.  He is exercising by walking daily at lunchtime and in the evenings with his wife.    Review of Systems  Constitutional: Negative.   HENT: Negative.    Eyes:  Negative for blurred vision, discharge and redness.  Respiratory: Negative.    Cardiovascular: Negative.   Gastrointestinal:  Negative for abdominal pain.  Genitourinary: Negative.  Negative for frequency, hematuria and urgency.  Musculoskeletal: Negative.  Negative for myalgias.  Skin:  Negative for rash.  Neurological:  Negative for tingling, loss of consciousness, weakness and headaches.  Endo/Heme/Allergies:  Negative for polydipsia.      Objective:     BP (!) 148/92 (BP Location: Left Arm, Patient Position: Sitting, Cuff Size: Normal)   Pulse 86   Temp 98.2 F (36.8 C) (Temporal)   Ht 5\' 8"  (1.727 m)   Wt 196 lb 6.4 oz (89.1 kg)   SpO2 97%   BMI 29.86 kg/m    Physical Exam Constitutional:      General: He is not in acute distress.    Appearance: Normal appearance. He is not ill-appearing, toxic-appearing or diaphoretic.  HENT:     Head: Normocephalic and atraumatic.     Right Ear: External ear normal.     Left Ear: External ear normal.  Eyes:     General: No scleral icterus.       Right eye: No discharge.        Left eye: No discharge.     Extraocular Movements: Extraocular movements  intact.     Conjunctiva/sclera: Conjunctivae normal.  Pulmonary:     Effort: Pulmonary effort is normal. No respiratory distress.  Skin:    General: Skin is warm and dry.  Neurological:     Mental Status: He is alert and oriented to person, place, and time.  Psychiatric:        Mood and Affect: Mood normal.        Behavior: Behavior normal.      No results found for any visits on 09/16/21.    The 10-year ASCVD risk score (Arnett DK, et al., 2019) is: 7.4%    Assessment & Plan:   Problem List Items Addressed This Visit       Cardiovascular and Mediastinum   Primary hypertension - Primary   Relevant Medications   amLODipine (NORVASC) 10 MG tablet   Other Relevant Orders   Basic metabolic panel     Other   Elevated PSA, less than 10 ng/ml   Relevant Orders   PSA    Return in about 6 months (around 03/18/2022), or if symptoms worsen or fail to improve.   Have increased amlodipine to 10 mg daily.  He will let me know if there is increased edema in his lower extremities.  Continue daily exercise 5 walking.  Rechecking PSA.  Can continue to follow it as long as it stays less than 10.  Can consider MRI or urology referral. Mliss Sax, MD   02/09/22 addendum: received word the patient has been cutting in amlodipine in 1/2 and taking 5mg  only.

## 2021-10-27 ENCOUNTER — Telehealth: Payer: Self-pay | Admitting: Family Medicine

## 2021-10-27 DIAGNOSIS — I10 Essential (primary) hypertension: Secondary | ICD-10-CM

## 2021-10-27 MED ORDER — AMLODIPINE BESYLATE 10 MG PO TABS: 10.0000 mg | ORAL_TABLET | Freq: Every day | ORAL | 2 refills | Status: DC

## 2021-10-27 NOTE — Telephone Encounter (Signed)
Caller Name: Jeffery Rivas Call back phone #: [phone]  MEDICATION(S): Amlodipine (10mg )  Days of Med Remaining: 0  Has the patient contacted their pharmacy (YES/NO)? yes What did pharmacy advise? Dr. needs to update prescription and send to the pharmacy  [facility]: [facility]) [address] [place], [place], [place]  ~~~Please advise patient/caregiver to allow 2-3 business days to process RX refills.

## 2021-10-27 NOTE — Telephone Encounter (Signed)
Requested Rx sent in  

## 2021-12-05 ENCOUNTER — Other Ambulatory Visit: Payer: Self-pay | Admitting: Family Medicine

## 2021-12-05 DIAGNOSIS — K219 Gastro-esophageal reflux disease without esophagitis: Secondary | ICD-10-CM

## 2021-12-05 DIAGNOSIS — I10 Essential (primary) hypertension: Secondary | ICD-10-CM

## 2021-12-05 DIAGNOSIS — F419 Anxiety disorder, unspecified: Secondary | ICD-10-CM

## 2021-12-23 ENCOUNTER — Other Ambulatory Visit: Payer: Self-pay | Admitting: Family Medicine

## 2021-12-23 DIAGNOSIS — G43901 Migraine, unspecified, not intractable, with status migrainosus: Secondary | ICD-10-CM

## 2022-02-05 ENCOUNTER — Telehealth: Payer: Self-pay | Admitting: Family Medicine

## 2022-02-05 NOTE — Telephone Encounter (Signed)
FYI:Pt's wife called in wanting Dr. Ethelene Hal to know, her husband has been taking his amLODipine (NORVASC) 10 MG, but cutting it in half to be 5mg  for the past 8weeks. His feet were swelling, so his pharmacist suggested he cut the pill in half.

## 2022-02-12 NOTE — Telephone Encounter (Signed)
Unable to reach pt, lvm for pt to call back.  To schedule appointment with Dr.Kremer in regard to swelling

## 2022-03-18 ENCOUNTER — Ambulatory Visit: Payer: BLUE CROSS/BLUE SHIELD | Admitting: Family Medicine

## 2022-03-18 ENCOUNTER — Encounter: Payer: Self-pay | Admitting: Family Medicine

## 2022-03-18 VITALS — BP 138/86 | HR 73 | Temp 97.4°F | Ht 68.0 in | Wt 198.0 lb

## 2022-03-18 DIAGNOSIS — F419 Anxiety disorder, unspecified: Secondary | ICD-10-CM

## 2022-03-18 DIAGNOSIS — I10 Essential (primary) hypertension: Secondary | ICD-10-CM

## 2022-03-18 DIAGNOSIS — K219 Gastro-esophageal reflux disease without esophagitis: Secondary | ICD-10-CM

## 2022-03-18 DIAGNOSIS — R972 Elevated prostate specific antigen [PSA]: Secondary | ICD-10-CM

## 2022-03-18 DIAGNOSIS — Z23 Encounter for immunization: Secondary | ICD-10-CM

## 2022-03-18 DIAGNOSIS — G43901 Migraine, unspecified, not intractable, with status migrainosus: Secondary | ICD-10-CM

## 2022-03-18 LAB — BASIC METABOLIC PANEL
BUN: 13 mg/dL (ref 6–23)
CO2: 29 mEq/L (ref 19–32)
Calcium: 9.5 mg/dL (ref 8.4–10.5)
Chloride: 104 mEq/L (ref 96–112)
Creatinine, Ser: 0.94 mg/dL (ref 0.40–1.50)
GFR: 89.79 mL/min (ref >60.00)
Glucose, Bld: 85 mg/dL (ref 70–99)
Potassium: 4.3 mEq/L (ref 3.5–5.1)
Sodium: 140 mEq/L (ref 135–145)

## 2022-03-18 LAB — PSA: PSA: 6.46 ng/mL — ABNORMAL HIGH (ref 0.10–4.00)

## 2022-03-18 MED ORDER — RIZATRIPTAN BENZOATE 10 MG PO TABS: ORAL_TABLET | ORAL | 0 refills | Status: DC

## 2022-03-18 MED ORDER — CITALOPRAM HYDROBROMIDE 10 MG PO TABS: 10.0000 mg | ORAL_TABLET | Freq: Every day | ORAL | 1 refills | Status: DC

## 2022-03-18 MED ORDER — AMLODIPINE BESYLATE 5 MG PO TABS: 5.0000 mg | ORAL_TABLET | Freq: Every day | ORAL | 1 refills | Status: DC

## 2022-03-18 MED ORDER — CARVEDILOL 3.125 MG PO TABS: 3.1250 mg | ORAL_TABLET | Freq: Two times a day (BID) | ORAL | 3 refills | Status: DC

## 2022-03-18 MED ORDER — FAMOTIDINE 20 MG PO TABS: 20.0000 mg | ORAL_TABLET | Freq: Every day | ORAL | 1 refills | Status: DC

## 2022-03-18 MED ORDER — ENALAPRIL MALEATE 20 MG PO TABS
20.0000 mg | ORAL_TABLET | Freq: Every day | ORAL | 1 refills | Status: DC
Start: 2022-03-18 — End: 2022-09-17

## 2022-03-18 NOTE — Progress Notes (Addendum)
Established Patient Office Visit   Subjective:  Patient ID: Jeffery Rivas, male    DOB: 03/30/1966  Age: 56 y.o. MRN: [id number]  Chief Complaint  Patient presents with   Follow-up    6 month follow up, no concerns. Patient fasting.     HPI Encounter Diagnoses  Name Primary?   Essential hypertension Yes   Primary hypertension    Gastroesophageal reflux disease, unspecified whether esophagitis present    Anxiety    Migraine with status migrainosus, not intractable, unspecified migraine type    Need for Tdap vaccination    Elevated PSA, less than 10 ng/ml    Follow-up of hypertension, GERD, migraine, anxiety and elevated PSA.  Urine flow remains about the same.  Anxiety well-controlled with Celexa 20 mg.  GERD controlled with famotidine 20 mg daily.  Amlodipine was increased to 10 mg last visit and he was to continue enalapril 20 mg.  Patient developed swelling in his lower extremities and made the decision to decrease his amlodipine.  Continues to exercise regularly.  He does not smoke.   Review of Systems  Constitutional: Negative.   HENT: Negative.    Eyes:  Negative for blurred vision, discharge and redness.  Respiratory: Negative.    Cardiovascular: Negative.   Gastrointestinal:  Negative for abdominal pain.  Genitourinary: Negative.   Musculoskeletal: Negative.  Negative for myalgias.  Skin:  Negative for rash.  Neurological:  Negative for tingling, loss of consciousness and weakness.  Endo/Heme/Allergies:  Negative for polydipsia.     Current Outpatient Medications:    amLODipine (NORVASC) 5 MG tablet, Take 1 tablet (5 mg total) by mouth daily., Disp: 90 tablet, Rfl: 1   carvedilol (COREG) 3.125 MG tablet, Take 1 tablet (3.125 mg total) by mouth 2 (two) times daily with a meal., Disp: 60 tablet, Rfl: 3   cetirizine (ZYRTEC) 10 MG tablet, Take 10 mg by mouth daily., Disp: , Rfl:    cholecalciferol (VITAMIN D) 1000 units tablet, Take 1,000 Units by mouth daily.,  Disp: , Rfl:    cyclobenzaprine (FLEXERIL) 10 MG tablet, Take by mouth., Disp: , Rfl:    ibuprofen (ADVIL,MOTRIN) 200 MG tablet, Take 2 tablets (400 mg total) by mouth every 8 (eight) hours as needed for mild pain., Disp: , Rfl:    ketorolac (TORADOL) 10 MG tablet, Take by mouth., Disp: , Rfl:    Melatonin 3 MG CAPS, Take by mouth., Disp: , Rfl:    promethazine (PHENERGAN) 25 MG tablet, Take by mouth., Disp: , Rfl:    citalopram (CELEXA) 10 MG tablet, Take 1 tablet (10 mg total) by mouth daily., Disp: 90 tablet, Rfl: 1   enalapril (VASOTEC) 20 MG tablet, Take 1 tablet (20 mg total) by mouth daily., Disp: 90 tablet, Rfl: 1   famotidine (PEPCID) 20 MG tablet, Take 1 tablet (20 mg total) by mouth daily., Disp: 90 tablet, Rfl: 1   rizatriptan (MAXALT) 10 MG tablet, TAKE ONE TABLET BY MOUTH AT ONSET OF HEADACHE; MAY REPEAT ONE TABLET IN 2 HOURS IF NEEDED., Disp: 10 tablet, Rfl: 0   Objective:     BP 138/86 (BP Location: Left Arm, Patient Position: Sitting, Cuff Size: Normal)   Pulse 73   Temp (!) 97.4 F (36.3 C) (Temporal)   Ht 5\' 8"  (1.727 m)   Wt 198 lb (89.8 kg)   SpO2 99%   BMI 30.11 kg/m  BP Readings from Last 3 Encounters:  03/18/22 138/86  09/16/21 (!) 148/92  06/15/21 (!) 148/92  Wt Readings from Last 3 Encounters:  03/18/22 198 lb (89.8 kg)  09/16/21 196 lb 6.4 oz (89.1 kg)  06/15/21 190 lb 12.8 oz (86.5 kg)      Physical Exam Constitutional:      General: He is not in acute distress.    Appearance: Normal appearance. He is not ill-appearing, toxic-appearing or diaphoretic.  HENT:     Head: Normocephalic and atraumatic.     Right Ear: External ear normal.     Left Ear: External ear normal.  Eyes:     General: No scleral icterus.       Right eye: No discharge.        Left eye: No discharge.     Extraocular Movements: Extraocular movements intact.     Conjunctiva/sclera: Conjunctivae normal.  Pulmonary:     Effort: Pulmonary effort is normal. No respiratory  distress.  Skin:    General: Skin is warm and dry.  Neurological:     Mental Status: He is alert and oriented to person, place, and time.  Psychiatric:        Mood and Affect: Mood normal.        Behavior: Behavior normal.      Results for orders placed or performed in visit on 03/18/22  Basic metabolic panel  Result Value Ref Range   Sodium 140 135 - 145 mEq/L   Potassium 4.3 3.5 - 5.1 mEq/L   Chloride 104 96 - 112 mEq/L   CO2 29 19 - 32 mEq/L   Glucose, Bld 85 70 - 99 mg/dL   BUN 13 6 - 23 mg/dL   Creatinine, Ser 8.41 0.40 - 1.50 mg/dL   GFR 66.06 >30.16 mL/min   Calcium 9.5 8.4 - 10.5 mg/dL  PSA  Result Value Ref Range   PSA 6.46 (H) 0.10 - 4.00 ng/mL      The 10-year ASCVD risk score (Arnett DK, et al., 2019) is: 6.5%    Assessment & Plan:   Essential hypertension -     Carvedilol; Take 1 tablet (3.125 mg total) by mouth 2 (two) times daily with a meal.  Dispense: 60 tablet; Refill: 3 -     amLODIPine Besylate; Take 1 tablet (5 mg total) by mouth daily.  Dispense: 90 tablet; Refill: 1  Primary hypertension -     Enalapril Maleate; Take 1 tablet (20 mg total) by mouth daily.  Dispense: 90 tablet; Refill: 1 -     Basic metabolic panel  Gastroesophageal reflux disease, unspecified whether esophagitis present -     Famotidine; Take 1 tablet (20 mg total) by mouth daily.  Dispense: 90 tablet; Refill: 1  Anxiety -     Citalopram Hydrobromide; Take 1 tablet (10 mg total) by mouth daily.  Dispense: 90 tablet; Refill: 1  Migraine with status migrainosus, not intractable, unspecified migraine type -     Rizatriptan Benzoate; TAKE ONE TABLET BY MOUTH AT ONSET OF HEADACHE; MAY REPEAT ONE TABLET IN 2 HOURS IF NEEDED.  Dispense: 10 tablet; Refill: 0 -     Carvedilol; Take 1 tablet (3.125 mg total) by mouth 2 (two) times daily with a meal.  Dispense: 60 tablet; Refill: 3  Need for Tdap vaccination -     Tdap vaccine greater than or equal to 7yo IM  Elevated PSA, less  than 10 ng/ml -     PSA -     MR PROSTATE W WO CONTRAST; Future    Return in about 3 months (around 06/17/2022),  or Bring your cuff with you next visit please..   Continue enalapril 20mg , amlodipine 5mg  and have added carvedilol 3.12 twice daily.  Should help with migraine prevention as well.  Will bring cuff next visit. Libby Maw, MD

## 2022-03-18 NOTE — Addendum Note (Signed)
Addended by: Andrez Grime on: 03/18/2022 02:52 PM   Modules accepted: Orders

## 2022-03-22 ENCOUNTER — Telehealth: Payer: Self-pay | Admitting: Family Medicine

## 2022-03-22 NOTE — Telephone Encounter (Signed)
[phone] please call pt back on this phone number. Pt stated she is returning your call

## 2022-03-23 ENCOUNTER — Other Ambulatory Visit: Payer: Self-pay

## 2022-03-23 DIAGNOSIS — R972 Elevated prostate specific antigen [PSA]: Secondary | ICD-10-CM

## 2022-03-23 NOTE — Telephone Encounter (Signed)
Spoke with patients wife who verbally understood labs and recommendations. Patient okay with MRI but will give [place] a call back regarding referral for urologist.

## 2022-04-08 ENCOUNTER — Ambulatory Visit (INDEPENDENT_AMBULATORY_CARE_PROVIDER_SITE_OTHER): Payer: 59 | Admitting: Urology

## 2022-04-08 ENCOUNTER — Encounter: Payer: Self-pay | Admitting: Urology

## 2022-04-08 VITALS — BP 160/104 | HR 86 | Ht 69.0 in | Wt 200.0 lb

## 2022-04-08 DIAGNOSIS — R972 Elevated prostate specific antigen [PSA]: Secondary | ICD-10-CM

## 2022-04-08 LAB — URINALYSIS
Bilirubin, UA: NEGATIVE
Blood, UA: NEGATIVE
Glucose, UA: NEGATIVE mg/dL
Leukocytes, UA: NEGATIVE
Nitrite, UA: NEGATIVE
Protein, UA: NEGATIVE
Spec Grav, UA: 1.02 (ref 1.010–1.025)
Urobilinogen, UA: 0.2 E.U./dL
pH, UA: 6 (ref 5.0–8.0)

## 2022-04-08 NOTE — Progress Notes (Signed)
Assessment: 1. Elevated PSA     Plan: I reviewed the patient's chart including provider notes and lab results. Today I had a long discussion with the patient regarding PSA and the rationale and controversies of prostate cancer early detection.  I discussed the pros and cons of further evaluation including TRUS and prostate Bx.  Potential adverse events and complications as well as standard instructions were given.  Patient expressed his understanding of these issues. Give him his persistently elevated PSA, I recommended that he strongly consider prostate biopsy. He would like to consider these recommendations and will contact me to schedule the biopsy procedure.  Chief Complaint:  Chief Complaint  Patient presents with   Elevated PSA    History of Present Illness:  Jeffery Rivas is a 57 y.o. male who is seen in consultation from Libby Maw, MD for evaluation of elevated PSA. PSA results: 11/18 2.49 12/22 5.46 6/23 5.09 12/23 6.46  No prior urologic evaluation.  No family history of prostate cancer.  No history of UTIs or prostatitis. He does have lower urinary tract symptoms including frequency, occasional urgency, morning hesitancy, and nocturia x 1.  No dysuria or gross hematuria. IPSS = 11 today.  Past Medical History:  Past Medical History:  Diagnosis Date   Anxiety    GERD (gastroesophageal reflux disease)    Hypertension     Past Surgical History:  Past Surgical History:  Procedure Laterality Date   CHOLECYSTECTOMY N/A 05/18/2015   Procedure: LAPAROSCOPIC CHOLECYSTECTOMY WITH INTRAOPERATIVE CHOLANGIOGRAM;  Surgeon: Alphonsa Overall, MD;  Location: [facility];  Service: General;  Laterality: N/A;   TONSILLECTOMY      Allergies:  Allergies  Allergen Reactions   Pollen Extract Other (See Comments)    Family History:  Family History  Problem Relation Age of Onset   COPD Mother    Healthy Father     Social History:  Social History   Tobacco Use    Smoking status: Never   Smokeless tobacco: Never  Vaping Use   Vaping Use: Never used  Substance Use Topics   Alcohol use: Yes    Comment: rare 2 beers a year   Drug use: No    Review of symptoms:  Constitutional:  Negative for unexplained weight loss, night sweats, fever, chills ENT:  Negative for nose bleeds, sinus pain, painful swallowing CV:  Negative for chest pain, shortness of breath, exercise intolerance, palpitations, loss of consciousness Resp:  Negative for cough, wheezing, shortness of breath GI:  Negative for nausea, vomiting, diarrhea, bloody stools GU:  Positives noted in HPI; otherwise negative for gross hematuria, dysuria, urinary incontinence Neuro:  Negative for seizures, poor balance, limb weakness, slurred speech Psych:  Negative for lack of energy, depression, anxiety Endocrine:  Negative for polydipsia, polyuria, symptoms of hypoglycemia (dizziness, hunger, sweating) Hematologic:  Negative for anemia, purpura, petechia, prolonged or excessive bleeding, use of anticoagulants  Allergic:  Negative for difficulty breathing or choking as a result of exposure to anything; no shellfish allergy; no allergic response (rash/itch) to materials, foods  Physical exam: BP (!) 160/104   Pulse 86   Ht 5\' 9"  (1.753 m)   Wt 200 lb (90.7 kg)   BMI 29.53 kg/m  GENERAL APPEARANCE:  Well appearing, well developed, well nourished, NAD HEENT: Atraumatic, Normocephalic, oropharynx clear. NECK: Supple without lymphadenopathy or thyromegaly. LUNGS: Clear to auscultation bilaterally. HEART: Regular Rate and Rhythm without murmurs, gallops, or rubs. ABDOMEN: Soft, non-tender, No Masses. EXTREMITIES: Moves all extremities well.  Without clubbing, cyanosis, or edema. NEUROLOGIC:  Alert and oriented x 3, normal gait, CN II-XII grossly intact.  MENTAL STATUS:  Appropriate. BACK:  Non-tender to palpation.  No CVAT SKIN:  Warm, dry and intact.   GU: Penis:  circumcised Meatus:  Normal Scrotum: normal, no masses Testis: normal without masses bilateral Epididymis: normal Prostate: 40 g, NT, no nodules Rectum: Normal tone,  no masses or tenderness   Results: U/A dipstick: 1+ ketones

## 2022-04-20 ENCOUNTER — Other Ambulatory Visit: Payer: Self-pay

## 2022-04-20 DIAGNOSIS — R972 Elevated prostate specific antigen [PSA]: Secondary | ICD-10-CM

## 2022-05-04 ENCOUNTER — Ambulatory Visit: Payer: BLUE CROSS/BLUE SHIELD | Admitting: Urology

## 2022-05-12 ENCOUNTER — Telehealth: Payer: Self-pay | Admitting: Family Medicine

## 2022-05-12 DIAGNOSIS — G43901 Migraine, unspecified, not intractable, with status migrainosus: Secondary | ICD-10-CM

## 2022-05-12 MED ORDER — KETOROLAC TROMETHAMINE 10 MG PO TABS: 10.0000 mg | ORAL_TABLET | Freq: Three times a day (TID) | ORAL | 0 refills | Status: DC | PRN

## 2022-05-12 MED ORDER — PROMETHAZINE HCL 25 MG PO TABS: 25.0000 mg | ORAL_TABLET | Freq: Three times a day (TID) | ORAL | 0 refills | Status: AC | PRN

## 2022-05-12 NOTE — Telephone Encounter (Signed)
MEDICATION: ketorolac (TORADOL) 10 MG tablet  promethazine (PHENERGAN) 25 MG tablet   PHARMACY: [facility] [id number] - [place], [place] - [address]   Comments: Originally prescribed by his neurologist when he used to live in [place] and he wanted to know if Dr Ethelene Hal could prescribe  **Let patient know to contact pharmacy at the end of the day to make sure medication is ready. **  ** Please notify patient to allow 48-72 hours to process**  **Encourage patient to contact the pharmacy for refills or they can request refills through [facility]**

## 2022-05-12 NOTE — Telephone Encounter (Signed)
Called to inform patient of medication and refill no answer LM on identified VM. Asked patient to call back with any questions.

## 2022-05-12 NOTE — Telephone Encounter (Signed)
Patient requesting Toradol and Phenergan refills be sent in last OV 03/18/22 next appointment 06/17/22 per patient requested prescriptions were originally prescribed by neurologist in [place] prior to coming here.

## 2022-05-25 ENCOUNTER — Other Ambulatory Visit: Payer: 59 | Admitting: Urology

## 2022-06-01 ENCOUNTER — Ambulatory Visit (HOSPITAL_BASED_OUTPATIENT_CLINIC_OR_DEPARTMENT_OTHER)
Admission: RE | Admit: 2022-06-01 | Discharge: 2022-06-01 | Disposition: A | Discharge status: Home / Self Care | Payer: 59 | Source: Ambulatory Visit | Attending: Urology | Admitting: Urology

## 2022-06-01 ENCOUNTER — Ambulatory Visit (INDEPENDENT_AMBULATORY_CARE_PROVIDER_SITE_OTHER): Payer: 59 | Admitting: Urology

## 2022-06-01 ENCOUNTER — Encounter: Payer: Self-pay | Admitting: Urology

## 2022-06-01 VITALS — BP 156/107 | HR 90 | Ht 68.0 in | Wt 195.0 lb

## 2022-06-01 DIAGNOSIS — R972 Elevated prostate specific antigen [PSA]: Secondary | ICD-10-CM | POA: Insufficient documentation

## 2022-06-01 LAB — URINALYSIS
Bilirubin, UA: NEGATIVE
Blood, UA: NEGATIVE
Glucose, UA: NEGATIVE mg/dL
Ketones, UA: NEGATIVE
Nitrite, UA: NEGATIVE
Protein, UA: POSITIVE — AB
Spec Grav, UA: >=1.03 — AB (ref 1.010–1.025)
Urobilinogen, UA: 0.2 E.U./dL
pH, UA: 6 (ref 5.0–8.0)

## 2022-06-01 MED ORDER — CEFTRIAXONE SODIUM 1 G IJ SOLR
1.0000 g | Freq: Once | INTRAMUSCULAR | Status: AC
  Administered 2022-06-01: 1 g via INTRAMUSCULAR

## 2022-06-01 NOTE — Progress Notes (Signed)
IM Injection  Patient is present today for an IM Injection for treatment of infection prevention post prostate biopsy. Drug: Ceftriaxone Dose:1g Location:Right upper outer buttocks Lot: [id number] Exp:11/2023 Patient tolerated well, no complications were noted  Performed by: Bradly Bienenstock CMA

## 2022-06-01 NOTE — Progress Notes (Signed)
Assessment: 1. Elevated PSA     Plan: Post biopsy instructions given Return to office in 7-10 days for biopsy results  Chief Complaint: Chief Complaint  Patient presents with   Prostate Biopsy    HPI: Jeffery Rivas is a 57 y.o. male who presents for continued evaluation of elevated PSA. PSA results: 11/18   2.49 12/22   5.46 6/23     5.09 12/23   6.46   No prior urologic evaluation.  No family history of prostate cancer.  No history of UTIs or prostatitis. He does have lower urinary tract symptoms including frequency, occasional urgency, morning hesitancy, and nocturia x 1.  No dysuria or gross hematuria. IPSS = 11.  He presents today for prostate biopsy.  No new urinary symptoms.    Portions of the above documentation were copied from a prior visit for review purposes only.  Allergies: Allergies  Allergen Reactions   Pollen Extract Other (See Comments)    PMH: Past Medical History:  Diagnosis Date   Anxiety    GERD (gastroesophageal reflux disease)    Hypertension     PSH: Past Surgical History:  Procedure Laterality Date   CHOLECYSTECTOMY N/A 05/18/2015   Procedure: LAPAROSCOPIC CHOLECYSTECTOMY WITH INTRAOPERATIVE CHOLANGIOGRAM;  Surgeon: Alphonsa Overall, MD;  Location: [facility];  Service: General;  Laterality: N/A;   TONSILLECTOMY      SH: Social History   Tobacco Use   Smoking status: Never   Smokeless tobacco: Never  Vaping Use   Vaping Use: Never used  Substance Use Topics   Alcohol use: Yes    Comment: rare 2 beers a year   Drug use: No    ROS: Constitutional:  Negative for fever, chills, weight loss CV: Negative for chest pain, previous MI, hypertension Respiratory:  Negative for shortness of breath, wheezing, sleep apnea, frequent cough GI:  Negative for nausea, vomiting, bloody stool, GERD  PE: BP (!) 156/107   Pulse 90   Ht '5\' 8"'$  (1.727 m)   Wt 195 lb (88.5 kg)   BMI 29.65 kg/m  GENERAL APPEARANCE:  Well appearing, well  developed, well nourished, NAD HEENT:  Atraumatic, normocephalic, oropharynx clear NECK:  Supple without lymphadenopathy or thyromegaly ABDOMEN:  Soft, non-tender, no masses EXTREMITIES:  Moves all extremities well, without clubbing, cyanosis, or edema NEUROLOGIC:  Alert and oriented x 3, normal gait, CN II-XII grossly intact MENTAL STATUS:  appropriate BACK:  Non-tender to palpation, No CVAT SKIN:  Warm, dry, and intact   Results: U/A dipstick: trace LE, trace protein  TRANSRECTAL ULTRASOUND AND PROSTATE BIOPSY  Indication:  Elevated PSA  Prophylactic antibiotic administration: Rocephin  All medications that could result in increased bleeding were discontinued within an appropriate period of the time of biopsy.  Risk including bleeding and infection were discussed.  Informed consent was obtained.  The patient was placed in the left lateral decubitus position.  PROCEDURE 1.  TRANSRECTAL ULTRASOUND OF THE PROSTATE  The 7 MHz transrectal probe was used to image the prostate.  Anal stenosis was not noted.  TRUS volume: 45.9 ml  Hypoechoic areas: None  Hyperechoic areas: None  Central calcifications: not present  Margins:  normal  Seminal Vesicles: normal   PROCEDURE 2:  PROSTATE BIOPSY  A periprostatic block was performed using 1% lidocaine and transrectal ultrasound guidance. Under transrectal ultrasound guidance, and using the Biopty gun, prostate biopsies were obtained systematically from the apex, mid gland, and base bilaterally.  A total of 12 cores were obtained.  Hemostasis  was obtained with gentle pressure on the prostate.  The procedures were well-tolerated.  No significant bleeding was noted at the end of the procedure.  The patient was stable for discharge from the office.

## 2022-06-03 ENCOUNTER — Encounter: Payer: Self-pay | Admitting: Urology

## 2022-06-08 ENCOUNTER — Ambulatory Visit: Payer: 59 | Admitting: Urology

## 2022-06-17 ENCOUNTER — Ambulatory Visit (INDEPENDENT_AMBULATORY_CARE_PROVIDER_SITE_OTHER): Payer: 59 | Admitting: Family Medicine

## 2022-06-17 ENCOUNTER — Encounter: Payer: Self-pay | Admitting: Family Medicine

## 2022-06-17 VITALS — BP 126/88 | HR 68 | Temp 98.4°F | Ht 68.0 in | Wt 198.0 lb

## 2022-06-17 DIAGNOSIS — F419 Anxiety disorder, unspecified: Secondary | ICD-10-CM

## 2022-06-17 DIAGNOSIS — R6882 Decreased libido: Secondary | ICD-10-CM | POA: Diagnosis not present

## 2022-06-17 DIAGNOSIS — I10 Essential (primary) hypertension: Secondary | ICD-10-CM

## 2022-06-17 DIAGNOSIS — Z13220 Encounter for screening for lipoid disorders: Secondary | ICD-10-CM | POA: Diagnosis not present

## 2022-06-17 LAB — LIPID PANEL
Cholesterol: 160 mg/dL (ref 0–200)
HDL: 54.2 mg/dL (ref >39.00)
LDL Cholesterol: 83 mg/dL (ref 0–99)
NonHDL: 105.63
Total CHOL/HDL Ratio: 3
Triglycerides: 115 mg/dL (ref 0.0–149.0)
VLDL: 23 mg/dL (ref 0.0–40.0)

## 2022-06-17 LAB — CBC
HCT: 46.1 % (ref 39.0–52.0)
Hemoglobin: 16 g/dL (ref 13.0–17.0)
MCHC: 34.8 g/dL (ref 30.0–36.0)
MCV: 89.6 fl (ref 78.0–100.0)
Platelets: 261 10*3/uL (ref 150.0–400.0)
RBC: 5.14 Mil/uL (ref 4.22–5.81)
RDW: 12.8 % (ref 11.5–15.5)
WBC: 7.8 10*3/uL (ref 4.0–10.5)

## 2022-06-17 LAB — COMPREHENSIVE METABOLIC PANEL
ALT: 23 U/L (ref 0–53)
AST: 18 U/L (ref 0–37)
Albumin: 4.1 g/dL (ref 3.5–5.2)
Alkaline Phosphatase: 66 U/L (ref 39–117)
BUN: 11 mg/dL (ref 6–23)
CO2: 29 mEq/L (ref 19–32)
Calcium: 9.5 mg/dL (ref 8.4–10.5)
Chloride: 104 mEq/L (ref 96–112)
Creatinine, Ser: 0.99 mg/dL (ref 0.40–1.50)
GFR: 84.23 mL/min (ref >60.00)
Glucose, Bld: 92 mg/dL (ref 70–99)
Potassium: 4.2 mEq/L (ref 3.5–5.1)
Sodium: 141 mEq/L (ref 135–145)
Total Bilirubin: 0.8 mg/dL (ref 0.2–1.2)
Total Protein: 7 g/dL (ref 6.0–8.3)

## 2022-06-17 MED ORDER — CARVEDILOL 6.25 MG PO TABS: 6.2500 mg | ORAL_TABLET | Freq: Two times a day (BID) | ORAL | 3 refills | Status: DC

## 2022-06-17 NOTE — Progress Notes (Signed)
Established Patient Office Visit   Subjective:  Patient ID: Jeffery Rivas, male    DOB: 05-10-1965  Age: 57 y.o. MRN: [id number]  Chief Complaint  Patient presents with   Medical Management of Chronic Issues    3 month follow up on BP, no concerns. Patient fasting.     HPI Encounter Diagnoses  Name Primary?   Primary hypertension Yes   Anxiety    Screening for hyperlipidemia    Libido, decreased    For follow-up of above.  Brings in blood pressure cuff today.  It does compare closely with our readings.  Pressures at home have been running in the 120s to 130s over upper 80s to 90s.  Continue Celexa for anxiety.  Continues to exercise by walking daily.  Wife is concerned about his energy levels.  He admits that his libido is not where it used to be.   Review of Systems  Constitutional: Negative.   HENT: Negative.    Eyes:  Negative for blurred vision, discharge and redness.  Respiratory: Negative.    Cardiovascular: Negative.   Gastrointestinal:  Negative for abdominal pain.  Genitourinary: Negative.   Musculoskeletal: Negative.  Negative for myalgias.  Skin:  Negative for rash.  Neurological:  Negative for tingling, loss of consciousness and weakness.  Endo/Heme/Allergies:  Negative for polydipsia.      06/17/2022    8:27 AM 03/18/2022    9:18 AM 03/18/2022    8:43 AM  Depression screen PHQ 2/9  Decreased Interest 0 0 0  Down, Depressed, Hopeless 0 0 0  PHQ - 2 Score 0 0 0  Altered sleeping  0   Tired, decreased energy  0   Change in appetite  0   Feeling bad or failure about yourself   0   Trouble concentrating  0   Moving slowly or fidgety/restless  0   Suicidal thoughts  0   PHQ-9 Score  0   Difficult doing work/chores  Not difficult at all        Current Outpatient Medications:    amLODipine (NORVASC) 5 MG tablet, Take 1 tablet (5 mg total) by mouth daily., Disp: 90 tablet, Rfl: 1   carvedilol (COREG) 6.25 MG tablet, Take 1 tablet (6.25 mg total) by  mouth 2 (two) times daily with a meal., Disp: 60 tablet, Rfl: 3   cetirizine (ZYRTEC) 10 MG tablet, Take 10 mg by mouth daily., Disp: , Rfl:    cholecalciferol (VITAMIN D) 1000 units tablet, Take 1,000 Units by mouth daily., Disp: , Rfl:    citalopram (CELEXA) 10 MG tablet, Take 1 tablet (10 mg total) by mouth daily., Disp: 90 tablet, Rfl: 1   cyclobenzaprine (FLEXERIL) 10 MG tablet, Take by mouth., Disp: , Rfl:    enalapril (VASOTEC) 20 MG tablet, Take 1 tablet (20 mg total) by mouth daily., Disp: 90 tablet, Rfl: 1   famotidine (PEPCID) 20 MG tablet, Take 1 tablet (20 mg total) by mouth daily., Disp: 90 tablet, Rfl: 1   ketorolac (TORADOL) 10 MG tablet, Take 1 tablet (10 mg total) by mouth every 8 (eight) hours as needed., Disp: 20 tablet, Rfl: 0   Melatonin 3 MG CAPS, Take by mouth., Disp: , Rfl:    promethazine (PHENERGAN) 25 MG tablet, Take 1 tablet (25 mg total) by mouth every 8 (eight) hours as needed for nausea or vomiting., Disp: 30 tablet, Rfl: 0   rizatriptan (MAXALT) 10 MG tablet, TAKE ONE TABLET BY MOUTH AT ONSET OF HEADACHE; MAY  REPEAT ONE TABLET IN 2 HOURS IF NEEDED., Disp: 10 tablet, Rfl: 0   Objective:     BP 126/88 (BP Location: Left Arm, Patient Position: Sitting, Cuff Size: Large)   Pulse 68   Temp 98.4 F (36.9 C) (Temporal)   Ht '5\' 8"'$  (1.727 m)   Wt 198 lb (89.8 kg)   SpO2 98%   BMI 30.11 kg/m  BP Readings from Last 3 Encounters:  06/17/22 126/88  06/01/22 (!) 156/107  04/08/22 (!) 160/104   Wt Readings from Last 3 Encounters:  06/17/22 198 lb (89.8 kg)  06/01/22 195 lb (88.5 kg)  04/08/22 200 lb (90.7 kg)      Physical Exam Constitutional:      General: He is not in acute distress.    Appearance: Normal appearance. He is not ill-appearing, toxic-appearing or diaphoretic.  HENT:     Head: Normocephalic and atraumatic.     Right Ear: External ear normal.     Left Ear: External ear normal.     Mouth/Throat:     Mouth: Mucous membranes are moist.      Pharynx: Oropharynx is clear. No oropharyngeal exudate or posterior oropharyngeal erythema.  Eyes:     General: No scleral icterus.       Right eye: No discharge.        Left eye: No discharge.     Extraocular Movements: Extraocular movements intact.     Conjunctiva/sclera: Conjunctivae normal.     Pupils: Pupils are equal, round, and reactive to light.  Cardiovascular:     Rate and Rhythm: Normal rate and regular rhythm.  Pulmonary:     Effort: Pulmonary effort is normal. No respiratory distress.     Breath sounds: Normal breath sounds.  Musculoskeletal:     Cervical back: No rigidity or tenderness.  Skin:    General: Skin is warm and dry.  Neurological:     Mental Status: He is alert and oriented to person, place, and time.  Psychiatric:        Mood and Affect: Mood normal.        Behavior: Behavior normal.      No results found for any visits on 06/17/22.    The 10-year ASCVD risk score (Arnett DK, et al., 2019) is: 6.2%    Assessment & Plan:   Primary hypertension -     CBC -     Comprehensive metabolic panel -     Carvedilol; Take 1 tablet (6.25 mg total) by mouth 2 (two) times daily with a meal.  Dispense: 60 tablet; Refill: 3  Anxiety  Screening for hyperlipidemia -     Lipid panel  Libido, decreased -     Testosterone Total,Free,Bio, Males    Return in about 3 months (around 09/17/2022).  Continue amlodipine 5 mg and enalapril 20 mg daily.  Have increased carvedilol to 6.25 twice daily.  Continue exercise.  Information was given on managing hypertension.  Reminded him again about avoiding sodium.  Screening for hyperlipidemia today.  Libby Maw, MD

## 2022-06-18 LAB — TESTOSTERONE TOTAL,FREE,BIO, MALES
Albumin: 4.1 g/dL (ref 3.6–5.1)
Sex Hormone Binding: 36 nmol/L (ref 22–77)
Testosterone, Bioavailable: 128.8 ng/dL (ref 110.0–575.0)
Testosterone, Free: 68.4 pg/mL (ref 46.0–224.0)
Testosterone: 527 ng/dL (ref 250–827)

## 2022-07-21 ENCOUNTER — Other Ambulatory Visit: Payer: Self-pay | Admitting: Family Medicine

## 2022-07-21 DIAGNOSIS — G43901 Migraine, unspecified, not intractable, with status migrainosus: Secondary | ICD-10-CM

## 2022-07-21 DIAGNOSIS — I10 Essential (primary) hypertension: Secondary | ICD-10-CM

## 2022-07-22 ENCOUNTER — Other Ambulatory Visit: Payer: Self-pay | Admitting: Family Medicine

## 2022-07-22 DIAGNOSIS — F419 Anxiety disorder, unspecified: Secondary | ICD-10-CM

## 2022-07-22 MED ORDER — CYCLOBENZAPRINE HCL 10 MG PO TABS: 10.0000 mg | ORAL_TABLET | Freq: Every evening | ORAL | 0 refills | Status: DC | PRN

## 2022-07-22 NOTE — Telephone Encounter (Signed)
Refill request for  Cyclobenzaprine 10 mg LR   hx provider LOV 06/17/22 FOV 09/17/22  Please review and advise.  Thanks. Dm/cma

## 2022-07-22 NOTE — Telephone Encounter (Signed)
Left detailed VM that RX was sent to pharmacy. Dm/cma

## 2022-07-22 NOTE — Telephone Encounter (Signed)
Pt stated he would like to know if you can presribe him this medication cyclobenzaprine (FLEXERIL) 10 MG tablet [[id number]]  his old dr use to prescribe this to him but since he been seeing dr. Doreene Burke he has not been prescribing this med

## 2022-08-19 ENCOUNTER — Other Ambulatory Visit: Payer: Self-pay | Admitting: Family Medicine

## 2022-08-19 DIAGNOSIS — G43901 Migraine, unspecified, not intractable, with status migrainosus: Secondary | ICD-10-CM

## 2022-09-17 ENCOUNTER — Ambulatory Visit: Payer: 59 | Admitting: Family Medicine

## 2022-09-17 ENCOUNTER — Encounter: Payer: Self-pay | Admitting: Family Medicine

## 2022-09-17 VITALS — BP 144/88 | HR 65 | Temp 98.1°F | Ht 68.0 in | Wt 204.0 lb

## 2022-09-17 DIAGNOSIS — K219 Gastro-esophageal reflux disease without esophagitis: Secondary | ICD-10-CM

## 2022-09-17 DIAGNOSIS — I10 Essential (primary) hypertension: Secondary | ICD-10-CM | POA: Diagnosis not present

## 2022-09-17 DIAGNOSIS — F419 Anxiety disorder, unspecified: Secondary | ICD-10-CM

## 2022-09-17 DIAGNOSIS — R5383 Other fatigue: Secondary | ICD-10-CM

## 2022-09-17 LAB — TSH: TSH: 1.69 u[IU]/mL (ref 0.35–5.50)

## 2022-09-17 LAB — BASIC METABOLIC PANEL
BUN: 13 mg/dL (ref 6–23)
CO2: 27 mEq/L (ref 19–32)
Calcium: 9.2 mg/dL (ref 8.4–10.5)
Chloride: 106 mEq/L (ref 96–112)
Creatinine, Ser: 0.92 mg/dL (ref 0.40–1.50)
GFR: 91.82 mL/min (ref >60.00)
Glucose, Bld: 98 mg/dL (ref 70–99)
Potassium: 4.4 mEq/L (ref 3.5–5.1)
Sodium: 141 mEq/L (ref 135–145)

## 2022-09-17 LAB — HEMOGLOBIN A1C: Hgb A1c MFr Bld: 5.2 % (ref 4.6–6.5)

## 2022-09-17 MED ORDER — CITALOPRAM HYDROBROMIDE 10 MG PO TABS: 10.0000 mg | ORAL_TABLET | Freq: Every day | ORAL | 1 refills | Status: DC

## 2022-09-17 MED ORDER — FAMOTIDINE 20 MG PO TABS: 20.0000 mg | ORAL_TABLET | Freq: Every day | ORAL | 1 refills | Status: DC

## 2022-09-17 MED ORDER — ENALAPRIL MALEATE 20 MG PO TABS: 20.0000 mg | ORAL_TABLET | Freq: Every day | ORAL | 1 refills | Status: DC

## 2022-09-17 MED ORDER — CARVEDILOL 6.25 MG PO TABS: 6.2500 mg | ORAL_TABLET | Freq: Two times a day (BID) | ORAL | 3 refills | Status: DC

## 2022-09-17 MED ORDER — AMLODIPINE BESYLATE 5 MG PO TABS: 5.0000 mg | ORAL_TABLET | Freq: Every day | ORAL | 1 refills | Status: DC

## 2022-09-17 NOTE — Progress Notes (Signed)
Established Patient Office Visit   Subjective:  Patient ID: Jeffery Rivas, male    DOB: 29-Jan-1966  Age: 57 y.o. MRN: [id number]  Chief Complaint  Patient presents with   Medical Management of Chronic Issues    3 month follow up, no concerns. Patient fasting.     HPI Encounter Diagnoses  Name Primary?   Primary hypertension Yes   Anxiety    Gastroesophageal reflux disease, unspecified whether esophagitis present    Other fatigue    Blood pressures at home are running in the 120-130/80 range.  His cuff has been calibrated here.  Continues to feel some fatigue.  He is exercising daily by walking.  Feels as though the Celexa is helping his anxiety.  Denies depression.  Distant family history of diabetes.  No weight loss or night sweats.  Otherwise doing well.   Review of Systems  Constitutional:  Positive for malaise/fatigue.  HENT: Negative.    Eyes:  Negative for blurred vision, discharge and redness.  Respiratory: Negative.    Cardiovascular: Negative.   Gastrointestinal:  Negative for abdominal pain.  Genitourinary: Negative.   Musculoskeletal: Negative.  Negative for myalgias.  Skin:  Negative for rash.  Neurological:  Negative for tingling, loss of consciousness and weakness.  Endo/Heme/Allergies:  Negative for polydipsia.  Psychiatric/Behavioral:  Negative for depression. The patient is nervous/anxious.       09/17/2022    8:04 AM 06/17/2022    8:27 AM 03/18/2022    9:18 AM  Depression screen PHQ 2/9  Decreased Interest 0 0 0  Down, Depressed, Hopeless 0 0 0  PHQ - 2 Score 0 0 0  Altered sleeping   0  Tired, decreased energy   0  Change in appetite   0  Feeling bad or failure about yourself    0  Trouble concentrating   0  Moving slowly or fidgety/restless   0  Suicidal thoughts   0  PHQ-9 Score   0  Difficult doing work/chores   Not difficult at all      Current Outpatient Medications:    cetirizine (ZYRTEC) 10 MG tablet, Take 10 mg by mouth daily.,  Disp: , Rfl:    cholecalciferol (VITAMIN D) 1000 units tablet, Take 1,000 Units by mouth daily., Disp: , Rfl:    cyclobenzaprine (FLEXERIL) 10 MG tablet, Take 1 tablet (10 mg total) by mouth at bedtime as needed for muscle spasms., Disp: 30 tablet, Rfl: 0   ketorolac (TORADOL) 10 MG tablet, Take 1 tablet (10 mg total) by mouth every 8 (eight) hours as needed., Disp: 20 tablet, Rfl: 0   Melatonin 3 MG CAPS, Take by mouth., Disp: , Rfl:    promethazine (PHENERGAN) 25 MG tablet, Take 1 tablet (25 mg total) by mouth every 8 (eight) hours as needed for nausea or vomiting., Disp: 30 tablet, Rfl: 0   rizatriptan (MAXALT) 10 MG tablet, TAKE 1 TABLET BY MOUTH AT ONSET OF HEADACHE; MAY REPEAT 1 TABLET IN 2 HOURS IF NEEDED., Disp: 10 tablet, Rfl: 0   amLODipine (NORVASC) 5 MG tablet, Take 1 tablet (5 mg total) by mouth daily., Disp: 90 tablet, Rfl: 1   carvedilol (COREG) 6.25 MG tablet, Take 1 tablet (6.25 mg total) by mouth 2 (two) times daily with a meal., Disp: 60 tablet, Rfl: 3   citalopram (CELEXA) 10 MG tablet, Take 1 tablet (10 mg total) by mouth daily., Disp: 90 tablet, Rfl: 1   enalapril (VASOTEC) 20 MG tablet, Take 1 tablet (20  mg total) by mouth daily., Disp: 90 tablet, Rfl: 1   famotidine (PEPCID) 20 MG tablet, Take 1 tablet (20 mg total) by mouth daily., Disp: 90 tablet, Rfl: 1   Objective:     BP (!) 144/88 (BP Location: Left Arm, Patient Position: Sitting, Cuff Size: Normal)   Pulse 65   Temp 98.1 F (36.7 C) (Temporal)   Ht 5\' 8"  (1.727 m)   Wt 204 lb (92.5 kg)   SpO2 98%   BMI 31.02 kg/m  BP Readings from Last 3 Encounters:  09/17/22 (!) 144/88  06/17/22 126/88  06/01/22 (!) 156/107   Wt Readings from Last 3 Encounters:  09/17/22 204 lb (92.5 kg)  06/17/22 198 lb (89.8 kg)  06/01/22 195 lb (88.5 kg)      Physical Exam Constitutional:      General: He is not in acute distress.    Appearance: Normal appearance. He is not ill-appearing, toxic-appearing or diaphoretic.   HENT:     Head: Normocephalic and atraumatic.     Right Ear: External ear normal.     Left Ear: External ear normal.  Eyes:     General: No scleral icterus.       Right eye: No discharge.        Left eye: No discharge.     Extraocular Movements: Extraocular movements intact.     Conjunctiva/sclera: Conjunctivae normal.  Pulmonary:     Effort: Pulmonary effort is normal. No respiratory distress.  Skin:    General: Skin is warm and dry.  Neurological:     Mental Status: He is alert and oriented to person, place, and time.  Psychiatric:        Mood and Affect: Mood normal.        Behavior: Behavior normal.      No results found for any visits on 09/17/22.    The 10-year ASCVD risk score (Arnett DK, et al., 2019) is: 7.8%    Assessment & Plan:   Primary hypertension -     amLODIPine Besylate; Take 1 tablet (5 mg total) by mouth daily.  Dispense: 90 tablet; Refill: 1 -     Carvedilol; Take 1 tablet (6.25 mg total) by mouth 2 (two) times daily with a meal.  Dispense: 60 tablet; Refill: 3 -     Enalapril Maleate; Take 1 tablet (20 mg total) by mouth daily.  Dispense: 90 tablet; Refill: 1 -     Basic metabolic panel  Anxiety -     Citalopram Hydrobromide; Take 1 tablet (10 mg total) by mouth daily.  Dispense: 90 tablet; Refill: 1  Gastroesophageal reflux disease, unspecified whether esophagitis present -     Famotidine; Take 1 tablet (20 mg total) by mouth daily.  Dispense: 90 tablet; Refill: 1  Other fatigue -     Hemoglobin A1c -     TSH    Return in about 6 months (around 03/19/2023), or if symptoms worsen or fail to improve.  Encourage exercise and weight loss.  Continue home medications as above.  [place] component of his hypertension.   Mliss Sax, MD

## 2022-12-15 ENCOUNTER — Encounter: Payer: Self-pay | Admitting: Urology

## 2022-12-15 ENCOUNTER — Ambulatory Visit: Payer: 59 | Admitting: Urology

## 2022-12-15 VITALS — BP 150/101 | HR 74 | Ht 68.0 in | Wt 196.0 lb

## 2022-12-15 DIAGNOSIS — R972 Elevated prostate specific antigen [PSA]: Secondary | ICD-10-CM

## 2022-12-15 LAB — URINALYSIS, ROUTINE W REFLEX MICROSCOPIC
Bilirubin, UA: NEGATIVE
Glucose, UA: NEGATIVE
Nitrite, UA: NEGATIVE
Protein,UA: NEGATIVE
RBC, UA: NEGATIVE
Specific Gravity, UA: 1.02 (ref 1.005–1.030)
Urobilinogen, Ur: 0.2 mg/dL (ref 0.2–1.0)
pH, UA: 6.5 (ref 5.0–7.5)

## 2022-12-15 LAB — MICROSCOPIC EXAMINATION

## 2022-12-15 NOTE — Progress Notes (Signed)
   Assessment: 1. Elevated PSA; negative biopsy 2/24     Plan: I again reviewed the prior negative biopsy results with the patient. PSA today. I discussed additional evaluation of an elevated PSA in the setting of a negative biopsy including biomarker testing, MRI, and repeat biopsy. Will contact him with results. Return to office in 6 months.  Chief Complaint: Chief Complaint  Patient presents with   Elevated PSA    HPI: Jeffery Rivas is a 57 y.o. male who presents for continued evaluation of elevated PSA. PSA results: 11/18   2.49 12/22   5.46 6/23     5.09 12/23   6.46   No prior urologic evaluation.  No family history of prostate cancer.  No history of UTIs or prostatitis. He does have lower urinary tract symptoms including frequency, occasional urgency, morning hesitancy, and nocturia x 1.  No dysuria or gross hematuria. IPSS = 11.  He is s/p a transrectal ultrasound and biopsy of the prostate on 06/01/22. TRUS volume:  45.9 ml  PSA density:  0.14 Biopsy results:  Negative for malignancy Complications after biopsy: none  He returns today for follow-up.  His lower urinary tract symptoms are stable.  He continues with frequency, decreased stream, and sensation of incomplete emptying.  His symptoms are primarily in the morning and improve throughout the day.  No dysuria or gross hematuria. IPSS = 11 today.    Portions of the above documentation were copied from a prior visit for review purposes only.  Allergies: Allergies  Allergen Reactions   Pollen Extract Other (See Comments)    PMH: Past Medical History:  Diagnosis Date   Anxiety    GERD (gastroesophageal reflux disease)    Hypertension     PSH: Past Surgical History:  Procedure Laterality Date   CHOLECYSTECTOMY N/A 05/18/2015   Procedure: LAPAROSCOPIC CHOLECYSTECTOMY WITH INTRAOPERATIVE CHOLANGIOGRAM;  Surgeon: Ovidio Kin, MD;  Location: [facility];  Service: General;  Laterality: N/A;   TONSILLECTOMY       SH: Social History   Tobacco Use   Smoking status: Never   Smokeless tobacco: Never  Vaping Use   Vaping status: Never Used  Substance Use Topics   Alcohol use: Yes    Comment: rare 2 beers a year   Drug use: No    ROS: Constitutional:  Negative for fever, chills, weight loss CV: Negative for chest pain, previous MI, hypertension Respiratory:  Negative for shortness of breath, wheezing, sleep apnea, frequent cough GI:  Negative for nausea, vomiting, bloody stool, GERD  PE: BP (!) 150/101   Pulse 74   Ht 5\' 8"  (1.727 m)   Wt 196 lb (88.9 kg)   BMI 29.80 kg/m  GENERAL APPEARANCE:  Well appearing, well developed, well nourished, NAD HEENT:  Atraumatic, normocephalic, oropharynx clear NECK:  Supple without lymphadenopathy or thyromegaly ABDOMEN:  Soft, non-tender, no masses EXTREMITIES:  Moves all extremities well, without clubbing, cyanosis, or edema NEUROLOGIC:  Alert and oriented x 3, normal gait, CN II-XII grossly intact MENTAL STATUS:  appropriate BACK:  Non-tender to palpation, No CVAT SKIN:  Warm, dry, and intact   Results: U/A: 0-5 WBC

## 2022-12-16 ENCOUNTER — Encounter: Payer: Self-pay | Admitting: Urology

## 2022-12-16 LAB — PSA: Prostate Specific Ag, Serum: 8 ng/mL — ABNORMAL HIGH (ref 0.0–4.0)

## 2023-01-26 ENCOUNTER — Encounter: Payer: Self-pay | Admitting: Urology

## 2023-01-28 ENCOUNTER — Other Ambulatory Visit: Payer: Self-pay | Admitting: Family Medicine

## 2023-01-28 DIAGNOSIS — G43901 Migraine, unspecified, not intractable, with status migrainosus: Secondary | ICD-10-CM

## 2023-02-20 ENCOUNTER — Other Ambulatory Visit: Payer: Self-pay | Admitting: Family Medicine

## 2023-02-20 DIAGNOSIS — I10 Essential (primary) hypertension: Secondary | ICD-10-CM

## 2023-02-23 ENCOUNTER — Ambulatory Visit: Payer: 59 | Admitting: Urology

## 2023-02-23 ENCOUNTER — Encounter: Payer: Self-pay | Admitting: Urology

## 2023-02-23 VITALS — BP 150/100 | HR 84 | Ht 68.0 in | Wt 198.0 lb

## 2023-02-23 DIAGNOSIS — R972 Elevated prostate specific antigen [PSA]: Secondary | ICD-10-CM

## 2023-02-23 NOTE — Progress Notes (Addendum)
   Assessment: 1. Elevated PSA; negative biopsy 2/24     Plan: I discussed additional evaluation of an elevated PSA in the setting of a negative biopsy including biomarker testing, MRI, and repeat biopsy. He would like to proceed with biomarker testing. [email or web address] sent - will contact him with results  Chief Complaint: Chief Complaint  Patient presents with   Elevated PSA    HPI: Jeffery Rivas is a 57 y.o. male who presents for continued evaluation of elevated PSA. PSA results: 11/18   2.49 12/22   5.46 6/23     5.09 12/23   6.46   No prior urologic evaluation.  No family history of prostate cancer.  No history of UTIs or prostatitis. He does have lower urinary tract symptoms including frequency, occasional urgency, morning hesitancy, and nocturia x 1.  No dysuria or gross hematuria. IPSS = 11.  He is s/p a transrectal ultrasound and biopsy of the prostate on 06/01/22. TRUS volume:  45.9 ml  PSA density:  0.14 Biopsy results:  Negative for malignancy Complications after biopsy: none  PSA 9/24: 8.0  He presents today for further evaluation with biomarker testing with MyProstateScore. His lower urinary tract symptoms are stable.  No dysuria or gross hematuria. IPSS = 10.   Portions of the above documentation were copied from a prior visit for review purposes only.  Allergies: Allergies  Allergen Reactions   Pollen Extract Other (See Comments)    PMH: Past Medical History:  Diagnosis Date   Anxiety    GERD (gastroesophageal reflux disease)    Hypertension     PSH: Past Surgical History:  Procedure Laterality Date   CHOLECYSTECTOMY N/A 05/18/2015   Procedure: LAPAROSCOPIC CHOLECYSTECTOMY WITH INTRAOPERATIVE CHOLANGIOGRAM;  Surgeon: Ovidio Kin, MD;  Location: [facility];  Service: General;  Laterality: N/A;   TONSILLECTOMY      SH: Social History   Tobacco Use   Smoking status: Never   Smokeless tobacco: Never  Vaping Use   Vaping status: Never  Used  Substance Use Topics   Alcohol use: Yes    Comment: rare 2 beers a year   Drug use: No    ROS: Constitutional:  Negative for fever, chills, weight loss CV: Negative for chest pain, previous MI, hypertension Respiratory:  Negative for shortness of breath, wheezing, sleep apnea, frequent cough GI:  Negative for nausea, vomiting, bloody stool, GERD   PE: BP (!) 150/100   Pulse 84   Ht 5\' 8"  (1.727 m)   Wt 198 lb (89.8 kg)   BMI 30.11 kg/m  GU:  prostate exam performed prior to obtaining urine sample  Results: None

## 2023-02-26 ENCOUNTER — Other Ambulatory Visit: Payer: Self-pay | Admitting: Family Medicine

## 2023-02-26 DIAGNOSIS — G43901 Migraine, unspecified, not intractable, with status migrainosus: Secondary | ICD-10-CM

## 2023-02-28 ENCOUNTER — Telehealth: Payer: Self-pay | Admitting: Family Medicine

## 2023-02-28 NOTE — Telephone Encounter (Signed)
Prescription Request  02/28/2023  LOV: 09/17/2022  What is the name of the medication or equipment? ketorolac (TORADOL) 10 MG tablet [[id number]]   Have you contacted your pharmacy to request a refill? Yes   Which pharmacy would you like this sent to?  [facility] [id number] [place], [place] - [address] [place] [address] Phone: [phone] Fax: [phone]    Patient notified that their request is being sent to the clinical staff for review and that they should receive a response within 2 business days.   Please advise at [phone]

## 2023-03-01 ENCOUNTER — Other Ambulatory Visit: Payer: Self-pay | Admitting: Family

## 2023-03-01 DIAGNOSIS — G43901 Migraine, unspecified, not intractable, with status migrainosus: Secondary | ICD-10-CM

## 2023-03-01 MED ORDER — KETOROLAC TROMETHAMINE 10 MG PO TABS: 10.0000 mg | ORAL_TABLET | Freq: Three times a day (TID) | ORAL | 0 refills | Status: DC | PRN

## 2023-03-11 ENCOUNTER — Encounter: Payer: Self-pay | Admitting: Urology

## 2023-03-14 ENCOUNTER — Encounter: Payer: Self-pay | Admitting: Urology

## 2023-03-17 ENCOUNTER — Ambulatory Visit: Payer: 59 | Admitting: Family Medicine

## 2023-03-21 ENCOUNTER — Ambulatory Visit: Payer: 59 | Admitting: Family Medicine

## 2023-03-28 ENCOUNTER — Encounter: Payer: Self-pay | Admitting: Family Medicine

## 2023-03-28 ENCOUNTER — Ambulatory Visit: Payer: 59 | Admitting: Family Medicine

## 2023-03-28 VITALS — BP 144/98 | Temp 97.5°F | Ht 68.0 in | Wt 208.0 lb

## 2023-03-28 DIAGNOSIS — I10 Essential (primary) hypertension: Secondary | ICD-10-CM

## 2023-03-28 LAB — COMPREHENSIVE METABOLIC PANEL
ALT: 20 U/L (ref 0–53)
AST: 17 U/L (ref 0–37)
Albumin: 4.3 g/dL (ref 3.5–5.2)
Alkaline Phosphatase: 67 U/L (ref 39–117)
BUN: 10 mg/dL (ref 6–23)
CO2: 30 meq/L (ref 19–32)
Calcium: 9.5 mg/dL (ref 8.4–10.5)
Chloride: 103 meq/L (ref 96–112)
Creatinine, Ser: 0.9 mg/dL (ref 0.40–1.50)
GFR: 93.92 mL/min (ref >60.00)
Glucose, Bld: 102 mg/dL — ABNORMAL HIGH (ref 70–99)
Potassium: 4 meq/L (ref 3.5–5.1)
Sodium: 141 meq/L (ref 135–145)
Total Bilirubin: 0.6 mg/dL (ref 0.2–1.2)
Total Protein: 7.3 g/dL (ref 6.0–8.3)

## 2023-03-28 MED ORDER — CARVEDILOL 12.5 MG PO TABS: 12.5000 mg | ORAL_TABLET | Freq: Two times a day (BID) | ORAL | 1 refills | Status: DC

## 2023-03-28 MED ORDER — ENALAPRIL MALEATE 20 MG PO TABS: 20.0000 mg | ORAL_TABLET | Freq: Every day | ORAL | 1 refills | Status: DC

## 2023-03-28 MED ORDER — AMLODIPINE BESYLATE 5 MG PO TABS: 5.0000 mg | ORAL_TABLET | Freq: Every day | ORAL | 1 refills | Status: DC

## 2023-03-28 NOTE — Progress Notes (Signed)
Established Patient Office Visit   Subjective:  Patient ID: Jeffery Rivas, male    DOB: 1966/01/02  Age: 57 y.o. MRN: [id number]  Chief Complaint  Patient presents with   Medical Management of Chronic Issues    6 month follow up. Pt is fasting no concerns.    HPI Encounter Diagnoses  Name Primary?   Primary hypertension Yes   Continues on medications as above.  Blood pressures at home running in the 130s to 140/80-90 range.  He is tolerating his medications well.  Exercise limited due to the weather.  Seeing urology for elevating PSA.   Review of Systems  Constitutional: Negative.   HENT: Negative.    Eyes:  Negative for blurred vision, discharge and redness.  Respiratory: Negative.    Cardiovascular: Negative.   Gastrointestinal:  Negative for abdominal pain.  Genitourinary: Negative.   Musculoskeletal: Negative.  Negative for myalgias.  Skin:  Negative for rash.  Neurological:  Negative for tingling, loss of consciousness and weakness.  Endo/Heme/Allergies:  Negative for polydipsia.     Current Outpatient Medications:    carvedilol (COREG) 12.5 MG tablet, Take 1 tablet (12.5 mg total) by mouth 2 (two) times daily with a meal., Disp: 180 tablet, Rfl: 1   cetirizine (ZYRTEC) 10 MG tablet, Take 10 mg by mouth daily., Disp: , Rfl:    cholecalciferol (VITAMIN D) 1000 units tablet, Take 1,000 Units by mouth daily., Disp: , Rfl:    citalopram (CELEXA) 10 MG tablet, Take 1 tablet (10 mg total) by mouth daily., Disp: 90 tablet, Rfl: 1   cyclobenzaprine (FLEXERIL) 10 MG tablet, Take 1 tablet (10 mg total) by mouth at bedtime as needed for muscle spasms., Disp: 30 tablet, Rfl: 0   famotidine (PEPCID) 20 MG tablet, Take 1 tablet (20 mg total) by mouth daily., Disp: 90 tablet, Rfl: 1   ketorolac (TORADOL) 10 MG tablet, Take 1 tablet (10 mg total) by mouth every 8 (eight) hours as needed., Disp: 20 tablet, Rfl: 0   Melatonin 3 MG CAPS, Take by mouth., Disp: , Rfl:    promethazine  (PHENERGAN) 25 MG tablet, Take 1 tablet (25 mg total) by mouth every 8 (eight) hours as needed for nausea or vomiting., Disp: 30 tablet, Rfl: 0   rizatriptan (MAXALT) 10 MG tablet, TAKE 1 TABLET BY MOUTH AT ONSET OF MIGRAINE; MAY REPEAT 1 TABLET IN 2 HOURS IF NEEDED., Disp: 9 tablet, Rfl: 0   amLODipine (NORVASC) 5 MG tablet, Take 1 tablet (5 mg total) by mouth daily., Disp: 90 tablet, Rfl: 1   enalapril (VASOTEC) 20 MG tablet, Take 1 tablet (20 mg total) by mouth daily., Disp: 90 tablet, Rfl: 1   Objective:     BP (!) 144/98   Temp (!) 97.5 F (36.4 C)   Ht 5\' 8"  (1.727 m)   Wt 208 lb (94.3 kg)   SpO2 96%   BMI 31.63 kg/m  BP Readings from Last 3 Encounters:  03/28/23 (!) 144/98  02/23/23 (!) 150/100  12/15/22 (!) 150/101   Wt Readings from Last 3 Encounters:  03/28/23 208 lb (94.3 kg)  02/23/23 198 lb (89.8 kg)  12/15/22 196 lb (88.9 kg)      Physical Exam Constitutional:      General: He is not in acute distress.    Appearance: Normal appearance. He is not ill-appearing, toxic-appearing or diaphoretic.  HENT:     Head: Normocephalic and atraumatic.     Right Ear: External ear normal.  Left Ear: External ear normal.  Eyes:     General: No scleral icterus.       Right eye: No discharge.        Left eye: No discharge.     Extraocular Movements: Extraocular movements intact.     Conjunctiva/sclera: Conjunctivae normal.  Pulmonary:     Effort: Pulmonary effort is normal. No respiratory distress.  Skin:    General: Skin is warm and dry.  Neurological:     Mental Status: He is alert and oriented to person, place, and time.  Psychiatric:        Mood and Affect: Mood normal.        Behavior: Behavior normal.      No results found for any visits on 03/28/23.    The 10-year ASCVD risk score (Arnett DK, et al., 2019) is: 7.8%    Assessment & Plan:   Primary hypertension -     Comprehensive metabolic panel -     amLODIPine Besylate; Take 1 tablet (5 mg  total) by mouth daily.  Dispense: 90 tablet; Refill: 1 -     Enalapril Maleate; Take 1 tablet (20 mg total) by mouth daily.  Dispense: 90 tablet; Refill: 1 -     Carvedilol; Take 1 tablet (12.5 mg total) by mouth 2 (two) times daily with a meal.  Dispense: 180 tablet; Refill: 1    Return in about 6 months (around 09/26/2023) for annual physical, chronic disease follow-up.  Have increased carvedilol to 12.5 twice daily  to be taken along with amlodipine 5 and enalapril 20 mg.  Information on managing hypertension was given.  Encouraged regular exercise by walking.  Follow-up with urology for elevating PSA.  Mliss Sax, MD

## 2023-05-17 ENCOUNTER — Other Ambulatory Visit: Payer: Self-pay | Admitting: Family Medicine

## 2023-05-17 NOTE — Telephone Encounter (Signed)
Refill request for Flexeril 10 mg LR 07/22/22, #30, 0 rf LOV  03/28/23 FOV  09/26/23  Please review and advise.  Thanks. Dm/cma

## 2023-05-17 NOTE — Telephone Encounter (Signed)
Copied from CRM [id number]. Topic: Clinical - Medication Refill >> May 17, 2023  4:18 PM Adaysia C wrote: Most Recent Primary Care Visit:  Provider: Mliss Sax  Department: [facility]  Visit Type: OFFICE VISIT  Date: 03/28/2023  Medication: cyclobenzaprine (FLEXERIL) 10 MG tablet  Has the patient contacted their pharmacy? Yes, patients pharmacy instructed him to initiate the refill through the provider because it was denied on their end. (Agent: If no, request that the patient contact the pharmacy for the refill. If patient does not wish to contact the pharmacy document the reason why and proceed with request.) (Agent: If yes, when and what did the pharmacy advise?)  Is this the correct pharmacy for this prescription? Yes If no, delete pharmacy and type the correct one.  This is the patient's preferred pharmacy:  [id number], [place] - [address] [place] [address] Phone: [phone] Fax: [phone]   Has the prescription been filled recently? No  Is the patient out of the medication? Yes  Has the patient been seen for an appointment in the last year OR does the patient have an upcoming appointment? Yes  Can we respond through MyChart? Yes  Agent: Please be advised that Rx refills may take up to 3 business days. We ask that you follow-up with your pharmacy.

## 2023-05-19 ENCOUNTER — Other Ambulatory Visit: Payer: Self-pay | Admitting: Family Medicine

## 2023-05-19 DIAGNOSIS — K219 Gastro-esophageal reflux disease without esophagitis: Secondary | ICD-10-CM

## 2023-05-19 DIAGNOSIS — I10 Essential (primary) hypertension: Secondary | ICD-10-CM

## 2023-05-19 DIAGNOSIS — F419 Anxiety disorder, unspecified: Secondary | ICD-10-CM

## 2023-05-19 NOTE — Telephone Encounter (Signed)
Patient wife is calling in regarding patient medication refill he is needing this medication refilled to keep this medication on hand for his headaches his wife would like a call back regarding this.

## 2023-05-21 ENCOUNTER — Other Ambulatory Visit: Payer: Self-pay | Admitting: Family Medicine

## 2023-05-21 DIAGNOSIS — I10 Essential (primary) hypertension: Secondary | ICD-10-CM

## 2023-06-08 ENCOUNTER — Ambulatory Visit (HOSPITAL_BASED_OUTPATIENT_CLINIC_OR_DEPARTMENT_OTHER)
Admission: RE | Admit: 2023-06-08 | Discharge: 2023-06-08 | Disposition: A | Discharge status: Home / Self Care | Source: Ambulatory Visit | Attending: Urology | Admitting: Urology

## 2023-06-08 ENCOUNTER — Ambulatory Visit: Admitting: Urology

## 2023-06-08 ENCOUNTER — Encounter: Payer: Self-pay | Admitting: Urology

## 2023-06-08 ENCOUNTER — Telehealth (HOSPITAL_BASED_OUTPATIENT_CLINIC_OR_DEPARTMENT_OTHER): Payer: Self-pay

## 2023-06-08 VITALS — BP 155/125 | HR 80 | Ht 68.0 in | Wt 192.0 lb

## 2023-06-08 DIAGNOSIS — R109 Unspecified abdominal pain: Secondary | ICD-10-CM

## 2023-06-08 DIAGNOSIS — R972 Elevated prostate specific antigen [PSA]: Secondary | ICD-10-CM

## 2023-06-08 LAB — URINALYSIS, ROUTINE W REFLEX MICROSCOPIC
Bilirubin, UA: NEGATIVE
Glucose, UA: NEGATIVE
Nitrite, UA: NEGATIVE
Protein,UA: NEGATIVE
RBC, UA: NEGATIVE
Specific Gravity, UA: <1.005 — ABNORMAL LOW (ref 1.005–1.030)
Urobilinogen, Ur: 0.2 mg/dL (ref 0.2–1.0)
pH, UA: 5.5 (ref 5.0–7.5)

## 2023-06-08 LAB — MICROSCOPIC EXAMINATION

## 2023-06-08 MED ORDER — KETOROLAC TROMETHAMINE 30 MG/ML IJ SOLN
30.0000 mg | Freq: Once | INTRAMUSCULAR | Status: AC
  Administered 2023-06-08: 30 mg via INTRAMUSCULAR

## 2023-06-08 NOTE — Progress Notes (Signed)
 Assessment: 1. Flank pain   2. Elevated PSA; negative biopsy 2/24     Plan: I reviewed the CT study from today.  I do not see any evidence of renal or ureteral calculi or obstruction that would account for his flank pain from a urologic standpoint.  Additionally, there is no evidence of UTI or prostatitis.  I discussed other possible causes for his pain including musculoskeletal or GI causes. Patient was given Toradol 30 mg IM today for management of his pain. He has a prescription for tramadol at home to use as needed. Recommend further evaluation by Dr. Doreene Burke for other causes of his flank/abdominal pain. Return to office in 2 weeks  Chief Complaint: Chief Complaint  Patient presents with   Elevated PSA    HPI: Jeffery Rivas is a 58 y.o. male who presents for evaluation of right flank pain.  He had onset of symptoms approximately 1 week ago with some recent worsening.  He has fairly constant pain in the right flank area with radiation into the right abdomen.  He does have episodes of increased pain.  He has had associated nausea without vomiting.  No fevers or chills.  He has noted a decreased force of stream.  He had 1 episode of dysuria.  No gross hematuria.  No history of kidney stones.  No recent imaging.  He reports that he has not had a bowel movement in several days.  He was previously seen for evaluation of elevated PSA. PSA results: 11/18   2.49 12/22   5.46 6/23     5.09 12/23   6.46   No prior urologic evaluation.  No family history of prostate cancer.  No history of UTIs or prostatitis. He does have lower urinary tract symptoms including frequency, occasional urgency, morning hesitancy, and nocturia x 1.  No dysuria or gross hematuria. IPSS = 11.  He is s/p a transrectal ultrasound and biopsy of the prostate on 06/01/22. TRUS volume:  45.9 ml  PSA density:  0.14 Biopsy results:  Negative for malignancy Complications after biopsy: none  PSA 9/24: 8.0 My  Prostate Score 2.0 from 11/24 showed a 6.3% risk of clinically significant grade group 2 or higher prostate cancer on biopsy. Further evaluation with a prostate MRI discussed with the patient.  He elected to hold off at that time.  Portions of the above documentation were copied from a prior visit for review purposes only.  Allergies: Allergies  Allergen Reactions   Pollen Extract Other (See Comments)    PMH: Past Medical History:  Diagnosis Date   Anxiety    GERD (gastroesophageal reflux disease)    Hypertension     PSH: Past Surgical History:  Procedure Laterality Date   CHOLECYSTECTOMY N/A 05/18/2015   Procedure: LAPAROSCOPIC CHOLECYSTECTOMY WITH INTRAOPERATIVE CHOLANGIOGRAM;  Surgeon: Ovidio Kin, MD;  Location: [facility];  Service: General;  Laterality: N/A;   TONSILLECTOMY      SH: Social History   Tobacco Use   Smoking status: Never   Smokeless tobacco: Never  Vaping Use   Vaping status: Never Used  Substance Use Topics   Alcohol use: Yes    Comment: rare 2 beers a year   Drug use: No    ROS: Constitutional:  Negative for fever, chills, weight loss CV: Negative for chest pain, previous MI, hypertension Respiratory:  Negative for shortness of breath, wheezing, sleep apnea, frequent cough GI:  Negative for nausea, vomiting, bloody stool, GERD   PE: BP (!) 155/125  Pulse 80   Ht 5\' 8"  (1.727 m)   Wt 192 lb (87.1 kg)   BMI 29.19 kg/m  GENERAL APPEARANCE:  Well appearing, well developed, well nourished, NAD HEENT:  Atraumatic, normocephalic, oropharynx clear NECK:  Supple without lymphadenopathy or thyromegaly ABDOMEN:  Soft, non-tender, no masses EXTREMITIES:  Moves all extremities well, without clubbing, cyanosis, or edema NEUROLOGIC:  Alert and oriented x 3, normal gait, CN II-XII grossly intact MENTAL STATUS:  appropriate BACK:  Non-tender to palpation, No CVAT SKIN:  Warm, dry, and intact GU: Prostate: 40 g, NT, no nodules Rectum: Normal tone,   no masses or tenderness   Results: Results for orders placed or performed in visit on 06/08/23 (from the past 24 hours)  Urinalysis, Routine w reflex microscopic     Status: Abnormal   Collection Time: 06/08/23 12:00 AM  Result Value Ref Range   Specific Gravity, UA <1.005 (L) 1.005 - 1.030   pH, UA 5.5 5.0 - 7.5   Color, UA Yellow Yellow   Appearance Ur Clear Clear   Leukocytes,UA Trace (A) Negative   Protein,UA Negative Negative/Trace   Glucose, UA Negative Negative   Ketones, UA 1+ (A) Negative   RBC, UA Negative Negative   Bilirubin, UA Negative Negative   Urobilinogen, Ur 0.2 0.2 - 1.0 mg/dL   Nitrite, UA Negative Negative   Microscopic Examination See below:    Narrative   Performed at:  [facility]  [facility] [address], [place], [place]  [id number] Lab Director: Corinna Lines MT, Phone:  [phone]  Microscopic Examination     Status: None   Collection Time: 06/08/23 12:00 AM   Urine  Result Value Ref Range   WBC, UA 0-5 0 - 5 /hpf   RBC, Urine 0-2 0 - 2 /hpf   Epithelial Cells (non renal) 0-10 0 - 10 /hpf   Bacteria, UA Few None seen/Few   Narrative   Performed at:  [email or web address]  [facility] [address], [place], [place]  [id number] Lab Director: Corinna Lines MT, Phone:  [phone]    CT RENAL STONE STUDY Result Date: 06/08/2023 CLINICAL DATA:  Right flank pain for 1 week. EXAM: CT ABDOMEN AND PELVIS WITHOUT CONTRAST TECHNIQUE: Multidetector CT imaging of the abdomen and pelvis was performed following the standard protocol without IV contrast. RADIATION DOSE REDUCTION: This exam was performed according to the departmental dose-optimization program which includes automated exposure control, adjustment of the mA and/or kV according to patient size and/or use of iterative reconstruction technique. COMPARISON:  05/18/2015 FINDINGS: Lower chest: No acute findings. Hepatobiliary: No mass visualized on this unenhanced  exam. Prior cholecystectomy. No evidence of biliary obstruction. Pancreas: No mass or inflammatory process visualized on this unenhanced exam. Spleen: Within normal limits in size. Small low-attenuation lesion in the anterior aspect of the spleen is stable, consistent with benign etiology. Adrenals/Urinary tract: No evidence of urolithiasis or hydronephrosis. Unremarkable unopacified urinary bladder. Stomach/Bowel: No evidence of obstruction, inflammatory process, or abnormal fluid collections. Normal appendix visualized. Vascular/Lymphatic: No pathologically enlarged lymph nodes identified. No evidence of abdominal aortic aneurysm. Reproductive:  Mildly enlarged prostate. Other:  None. Musculoskeletal:  No suspicious bone lesions identified. IMPRESSION: No evidence of urolithiasis, hydronephrosis, or other acute findings. Mildly enlarged prostate. Electronically Signed   By: Danae Orleans M.D.   On: 06/08/2023 17:35

## 2023-06-08 NOTE — Progress Notes (Signed)
 IM Injection  Patient is present today for an IM Injection for treatment of pain Drug: Toradol Dose:30mg  Location:right glute Lot: [id number] Exp:04/25 Patient tolerated well, no complications were noted  Performed by: Pola Furno N., CMA(AAMA)

## 2023-06-14 ENCOUNTER — Ambulatory Visit: Payer: 59 | Admitting: Urology

## 2023-06-22 ENCOUNTER — Encounter: Payer: Self-pay | Admitting: Urology

## 2023-06-22 ENCOUNTER — Ambulatory Visit: Admitting: Urology

## 2023-06-22 VITALS — BP 152/103 | HR 62 | Ht 68.0 in | Wt 190.0 lb

## 2023-06-22 DIAGNOSIS — R972 Elevated prostate specific antigen [PSA]: Secondary | ICD-10-CM | POA: Diagnosis not present

## 2023-06-22 DIAGNOSIS — R109 Unspecified abdominal pain: Secondary | ICD-10-CM

## 2023-06-22 LAB — MICROSCOPIC EXAMINATION

## 2023-06-22 LAB — URINALYSIS, ROUTINE W REFLEX MICROSCOPIC
Bilirubin, UA: NEGATIVE
Glucose, UA: NEGATIVE
Ketones, UA: NEGATIVE
Nitrite, UA: NEGATIVE
Protein,UA: NEGATIVE
RBC, UA: NEGATIVE
Specific Gravity, UA: 1.02 (ref 1.005–1.030)
Urobilinogen, Ur: 0.2 mg/dL (ref 0.2–1.0)
pH, UA: 6 (ref 5.0–7.5)

## 2023-06-22 NOTE — Progress Notes (Signed)
 Assessment: 1. Elevated PSA; negative biopsy 2/24   2. Flank pain - resolved     Plan: Free and total PSA today Will contact with results and to arrange follow-up  Chief Complaint: Chief Complaint  Patient presents with   Flank Pain    HPI: Jeffery Rivas is a 58 y.o. male who presents for continued evaluation of right flank pain and elevated PSA.   He was seen on 06/08/23 for evaluation of right flank pain.  His symptoms began approximately 1 week prior with some recent worsening.  He had fairly constant pain in the right flank area with radiation into the right abdomen.  He had associated nausea without vomiting.  No fevers or chills.  He noted a decreased force of stream and 1 episode of dysuria.  No gross hematuria.  No history of kidney stones. He had not had a bowel movement in several days. CT renal stone study from 06/08/2023 showed no evidence of renal or ureteral calculi and no evidence of obstruction. No evidence of prostatitis on exam or UTI.  He was given a dose of Toradol in the office and a prescription for tramadol.  He was previously seen for evaluation of elevated PSA. PSA results: 11/18   2.49 12/22   5.46 6/23     5.09 12/23   6.46   No prior urologic evaluation.  No family history of prostate cancer.  No history of UTIs or prostatitis. He does have lower urinary tract symptoms including frequency, occasional urgency, morning hesitancy, and nocturia x 1.  No dysuria or gross hematuria. IPSS = 11.  He is s/p a transrectal ultrasound and biopsy of the prostate on 06/01/22. TRUS volume:  45.9 ml  PSA density:  0.14 Biopsy results:  Negative for malignancy Complications after biopsy: none  PSA 9/24: 8.0 My Prostate Score 2.0 from 11/24 showed a 6.3% risk of clinically significant grade group 2 or higher prostate cancer on biopsy. Further evaluation with a prostate MRI discussed with the patient.  He elected to hold off at that time.  He returns today for  follow-up.  His right sided flank pain has resolved.  This lasted for approximately 4 days after his last visit.  He is not having any current flank or abdominal pain.  His urinary symptoms are at baseline and intermittent in nature.  No dysuria or gross hematuria. IPSS = 7/3.   Portions of the above documentation were copied from a prior visit for review purposes only.  Allergies: Allergies  Allergen Reactions   Pollen Extract Other (See Comments)    PMH: Past Medical History:  Diagnosis Date   Anxiety    GERD (gastroesophageal reflux disease)    Hypertension     PSH: Past Surgical History:  Procedure Laterality Date   CHOLECYSTECTOMY N/A 05/18/2015   Procedure: LAPAROSCOPIC CHOLECYSTECTOMY WITH INTRAOPERATIVE CHOLANGIOGRAM;  Surgeon: Ovidio Kin, MD;  Location: [facility];  Service: General;  Laterality: N/A;   TONSILLECTOMY      SH: Social History   Tobacco Use   Smoking status: Never   Smokeless tobacco: Never  Vaping Use   Vaping status: Never Used  Substance Use Topics   Alcohol use: Yes    Comment: rare 2 beers a year   Drug use: No    ROS: Constitutional:  Negative for fever, chills, weight loss CV: Negative for chest pain, previous MI, hypertension Respiratory:  Negative for shortness of breath, wheezing, sleep apnea, frequent cough GI:  Negative for nausea, vomiting,  bloody stool, GERD   PE: BP (!) 152/103   Pulse 62   Ht 5\' 8"  (1.727 m)   Wt 190 lb (86.2 kg)   BMI 28.89 kg/m  GENERAL APPEARANCE:  Well appearing, well developed, well nourished, NAD HEENT:  Atraumatic, normocephalic, oropharynx clear NECK:  Supple without lymphadenopathy or thyromegaly ABDOMEN:  Soft, non-tender, no masses EXTREMITIES:  Moves all extremities well, without clubbing, cyanosis, or edema NEUROLOGIC:  Alert and oriented x 3, normal gait, CN II-XII grossly intact MENTAL STATUS:  appropriate BACK:  Non-tender to palpation, No CVAT SKIN:  Warm, dry, and  intact   Results: U/A: 0-5 WBC, 0-2 RBC, few bacteria

## 2023-06-23 ENCOUNTER — Encounter: Payer: Self-pay | Admitting: Urology

## 2023-06-23 LAB — PSA, TOTAL AND FREE
PSA, Free Pct: 9.8 %
PSA, Free: 0.94 ng/mL
Prostate Specific Ag, Serum: 9.6 ng/mL — ABNORMAL HIGH (ref 0.0–4.0)

## 2023-07-11 ENCOUNTER — Encounter: Payer: Self-pay | Admitting: Urology

## 2023-07-12 ENCOUNTER — Other Ambulatory Visit: Payer: Self-pay | Admitting: Urology

## 2023-07-12 DIAGNOSIS — R972 Elevated prostate specific antigen [PSA]: Secondary | ICD-10-CM

## 2023-07-13 ENCOUNTER — Other Ambulatory Visit: Payer: Self-pay | Admitting: Family Medicine

## 2023-07-13 DIAGNOSIS — G43901 Migraine, unspecified, not intractable, with status migrainosus: Secondary | ICD-10-CM

## 2023-07-20 NOTE — Addendum Note (Signed)
 Addended by: General Kenner on: 07/20/2023 09:54 AM   Modules accepted: Orders

## 2023-07-21 ENCOUNTER — Telehealth: Payer: Self-pay | Admitting: Urology

## 2023-07-21 NOTE — Telephone Encounter (Signed)
 Patient received a phone call after MRI PA approval wanting to change the location of the MRI to a cheaper location. Patient gave the "[facility]" location. I called, obtained the fax number and faxed all of the information to that location. Received a fax back today from that location stating that they do not do prostate MRIs.  I called patients wife back to let her know this. She is going to call other locations to see who will do the prostate MRI and call our office back with the information.

## 2023-07-25 ENCOUNTER — Telehealth: Payer: Self-pay | Admitting: Urology

## 2023-07-25 NOTE — Telephone Encounter (Signed)
 Disregard message. Looks like the location on the order was updated by someone else. Thank you

## 2023-07-25 NOTE — Telephone Encounter (Signed)
 Checked patients chart today. Noticed that MRI was scheduled for original location that Dr Willye Harvey wanted to schedule for. Location in Order needs to be changed back to [facility]. Updated referral.   Can you please change the location on the order back to the [address] location? Insurance company still has it approved for [address].

## 2023-08-02 ENCOUNTER — Encounter: Payer: Self-pay | Admitting: Urology

## 2023-08-26 ENCOUNTER — Ambulatory Visit
Admission: RE | Admit: 2023-08-26 | Discharge: 2023-08-26 | Disposition: A | Discharge status: Home / Self Care | Source: Ambulatory Visit | Attending: Urology | Admitting: Urology

## 2023-08-26 DIAGNOSIS — R972 Elevated prostate specific antigen [PSA]: Secondary | ICD-10-CM

## 2023-08-26 MED ORDER — GADOPICLENOL 0.5 MMOL/ML IV SOLN
9.0000 mL | Freq: Once | INTRAVENOUS | Status: AC | PRN
  Administered 2023-08-26: 9 mL via INTRAVENOUS

## 2023-08-31 ENCOUNTER — Ambulatory Visit: Payer: Self-pay | Admitting: Urology

## 2023-09-08 ENCOUNTER — Telehealth: Payer: Self-pay | Admitting: Urology

## 2023-09-08 NOTE — Telephone Encounter (Signed)
 Called pt to schedule apt for September 2025 per Dr Willye Harvey.

## 2023-09-14 NOTE — Telephone Encounter (Signed)
 Called and lvm for pt to call and schedule follow up with [facility] in September per [facility].

## 2023-09-26 ENCOUNTER — Ambulatory Visit: Payer: Self-pay | Admitting: Family Medicine

## 2023-09-26 ENCOUNTER — Ambulatory Visit (INDEPENDENT_AMBULATORY_CARE_PROVIDER_SITE_OTHER): Payer: 59 | Admitting: Family Medicine

## 2023-09-26 ENCOUNTER — Encounter: Payer: Self-pay | Admitting: Family Medicine

## 2023-09-26 VITALS — BP 124/80 | HR 82 | Temp 97.1°F | Ht 68.0 in | Wt 201.6 lb

## 2023-09-26 DIAGNOSIS — Z13220 Encounter for screening for lipoid disorders: Secondary | ICD-10-CM | POA: Diagnosis not present

## 2023-09-26 DIAGNOSIS — Z131 Encounter for screening for diabetes mellitus: Secondary | ICD-10-CM

## 2023-09-26 DIAGNOSIS — Z0001 Encounter for general adult medical examination with abnormal findings: Secondary | ICD-10-CM

## 2023-09-26 DIAGNOSIS — H6122 Impacted cerumen, left ear: Secondary | ICD-10-CM

## 2023-09-26 DIAGNOSIS — I10 Essential (primary) hypertension: Secondary | ICD-10-CM | POA: Diagnosis not present

## 2023-09-26 DIAGNOSIS — Z0000 Encounter for general adult medical examination without abnormal findings: Secondary | ICD-10-CM

## 2023-09-26 LAB — COMPREHENSIVE METABOLIC PANEL WITH GFR
ALT: 27 U/L (ref 0–53)
AST: 18 U/L (ref 0–37)
Albumin: 4.2 g/dL (ref 3.5–5.2)
Alkaline Phosphatase: 53 U/L (ref 39–117)
BUN: 16 mg/dL (ref 6–23)
CO2: 29 meq/L (ref 19–32)
Calcium: 9.2 mg/dL (ref 8.4–10.5)
Chloride: 107 meq/L (ref 96–112)
Creatinine, Ser: 0.97 mg/dL (ref 0.40–1.50)
GFR: 85.55 mL/min (ref >60.00)
Glucose, Bld: 101 mg/dL — ABNORMAL HIGH (ref 70–99)
Potassium: 4 meq/L (ref 3.5–5.1)
Sodium: 143 meq/L (ref 135–145)
Total Bilirubin: 0.5 mg/dL (ref 0.2–1.2)
Total Protein: 6.7 g/dL (ref 6.0–8.3)

## 2023-09-26 LAB — CBC WITH DIFFERENTIAL/PLATELET
Basophils Absolute: 0.1 10*3/uL (ref 0.0–0.1)
Basophils Relative: 1.1 % (ref 0.0–3.0)
Eosinophils Absolute: 0.2 10*3/uL (ref 0.0–0.7)
Eosinophils Relative: 2 % (ref 0.0–5.0)
HCT: 43.7 % (ref 39.0–52.0)
Hemoglobin: 14.6 g/dL (ref 13.0–17.0)
Lymphocytes Relative: 26.5 % (ref 12.0–46.0)
Lymphs Abs: 2 10*3/uL (ref 0.7–4.0)
MCHC: 33.5 g/dL (ref 30.0–36.0)
MCV: 89.6 fl (ref 78.0–100.0)
Monocytes Absolute: 0.9 10*3/uL (ref 0.1–1.0)
Monocytes Relative: 11.5 % (ref 3.0–12.0)
Neutro Abs: 4.5 10*3/uL (ref 1.4–7.7)
Neutrophils Relative %: 58.9 % (ref 43.0–77.0)
Platelets: 239 10*3/uL (ref 150.0–400.0)
RBC: 4.87 Mil/uL (ref 4.22–5.81)
RDW: 12.9 % (ref 11.5–15.5)
WBC: 7.6 10*3/uL (ref 4.0–10.5)

## 2023-09-26 LAB — LIPID PANEL
Cholesterol: 152 mg/dL (ref 0–200)
HDL: 52.5 mg/dL (ref >39.00)
LDL Cholesterol: 84 mg/dL (ref 0–99)
NonHDL: 99.05
Total CHOL/HDL Ratio: 3
Triglycerides: 74 mg/dL (ref 0.0–149.0)
VLDL: 14.8 mg/dL (ref 0.0–40.0)

## 2023-09-26 LAB — HEMOGLOBIN A1C: Hgb A1c MFr Bld: 5.5 % (ref 4.6–6.5)

## 2023-09-26 LAB — URINALYSIS, ROUTINE W REFLEX MICROSCOPIC
Bilirubin Urine: NEGATIVE
Hgb urine dipstick: NEGATIVE
Leukocytes,Ua: NEGATIVE
Nitrite: NEGATIVE
RBC / HPF: NONE SEEN (ref 0–?)
Specific Gravity, Urine: >=1.03 — AB (ref 1.000–1.030)
Total Protein, Urine: NEGATIVE
Urine Glucose: NEGATIVE
Urobilinogen, UA: 0.2 (ref 0.0–1.0)
pH: 6 (ref 5.0–8.0)

## 2023-09-26 MED ORDER — DEBROX 6.5 % OT SOLN: 5.0000 [drp] | Freq: Two times a day (BID) | OTIC | 4 refills | Status: AC

## 2023-09-26 NOTE — Progress Notes (Signed)
 Established Patient Office Visit   Subjective:  Patient ID: Jeffery Rivas, male    DOB: 1965-11-29  Age: 58 y.o. MRN: [id number]  Chief Complaint  Patient presents with   Annual Exam    Cpe. Pt is fasting.     HPI Encounter Diagnoses  Name Primary?   Healthcare maintenance Yes   Primary hypertension    Screening for hyperlipidemia    Screening for diabetes mellitus    Cerumen debris on tympanic membrane of left ear    For physical exam and follow-up of hypertension.  Blood pressure well-controlled with amlodipine , enalapril  and carvedilol .  Continues to exercise by walking.  Has regular dental care.  Elevated PSA within normal MRI of prostate.  Continues follow-up with urology.   Review of Systems  Constitutional: Negative.   HENT: Negative.    Eyes:  Negative for blurred vision, discharge and redness.  Respiratory: Negative.    Cardiovascular: Negative.   Gastrointestinal:  Negative for abdominal pain.  Genitourinary: Negative.   Musculoskeletal: Negative.  Negative for myalgias.  Skin:  Negative for rash.  Neurological:  Negative for tingling, loss of consciousness and weakness.  Endo/Heme/Allergies:  Negative for polydipsia.      09/26/2023    9:35 AM 09/17/2022    8:04 AM 06/17/2022    8:27 AM  Depression screen PHQ 2/9  Decreased Interest 0 0 0  Down, Depressed, Hopeless 0 0 0  PHQ - 2 Score 0 0 0       Current Outpatient Medications:    amLODipine  (NORVASC ) 5 MG tablet, Take 1 tablet (5 mg total) by mouth daily., Disp: 90 tablet, Rfl: 1   carbamide peroxide (DEBROX) 6.5 % OTIC solution, Place 5 drops into the left ear 2 (two) times daily., Disp: 15 mL, Rfl: 4   carvedilol  (COREG ) 12.5 MG tablet, Take 1 tablet (12.5 mg total) by mouth 2 (two) times daily with a meal., Disp: 180 tablet, Rfl: 1   cetirizine (ZYRTEC) 10 MG tablet, Take 10 mg by mouth daily., Disp: , Rfl:    cholecalciferol (VITAMIN D) 1000 units tablet, Take 1,000 Units by mouth daily., Disp:  , Rfl:    citalopram  (CELEXA ) 10 MG tablet, TAKE 1 TABLET BY MOUTH DAILY, Disp: 90 tablet, Rfl: 1   cyclobenzaprine  (FLEXERIL ) 10 MG tablet, Take 1 tablet (10 mg total) by mouth at bedtime as needed for muscle spasms., Disp: 30 tablet, Rfl: 0   enalapril  (VASOTEC ) 20 MG tablet, Take 1 tablet (20 mg total) by mouth daily., Disp: 90 tablet, Rfl: 1   famotidine  (PEPCID ) 20 MG tablet, TAKE 1 TABLET BY MOUTH DAILY, Disp: 90 tablet, Rfl: 1   ketorolac  (TORADOL ) 10 MG tablet, Take 1 tablet (10 mg total) by mouth every 8 (eight) hours as needed., Disp: 20 tablet, Rfl: 0   Melatonin 3 MG CAPS, Take by mouth., Disp: , Rfl:    promethazine  (PHENERGAN ) 25 MG tablet, Take 1 tablet (25 mg total) by mouth every 8 (eight) hours as needed for nausea or vomiting., Disp: 30 tablet, Rfl: 0   rizatriptan  (MAXALT ) 10 MG tablet, TAKE 1 TABLET BY MOUTH AT ONSET OF MIGRAINE; MAY REPEAT 1 TABLET IN 2 HOURS IF NEEDED., Disp: 9 tablet, Rfl: 0   Objective:     BP 124/80 (Cuff Size: Normal)   Pulse 82   Temp (!) 97.1 F (36.2 C) (Temporal)   Ht 5' 8 (1.727 m)   Wt 201 lb 9.6 oz (91.4 kg)   SpO2 99%  BMI 30.65 kg/m  BP Readings from Last 3 Encounters:  09/26/23 124/80  06/22/23 (!) 152/103  06/08/23 (!) 155/125   Wt Readings from Last 3 Encounters:  09/26/23 201 lb 9.6 oz (91.4 kg)  06/22/23 190 lb (86.2 kg)  06/08/23 192 lb (87.1 kg)      Physical Exam Constitutional:      General: He is not in acute distress.    Appearance: Normal appearance. He is not ill-appearing, toxic-appearing or diaphoretic.  HENT:     Head: Normocephalic and atraumatic.     Right Ear: Tympanic membrane, ear canal and external ear normal.     Left Ear: Tympanic membrane, ear canal and external ear normal.     Ears:      Mouth/Throat:     Mouth: Mucous membranes are moist.     Pharynx: Oropharynx is clear. No oropharyngeal exudate or posterior oropharyngeal erythema.   Eyes:     General: No scleral icterus.       Right  eye: No discharge.        Left eye: No discharge.     Extraocular Movements: Extraocular movements intact.     Conjunctiva/sclera: Conjunctivae normal.     Pupils: Pupils are equal, round, and reactive to light.    Cardiovascular:     Rate and Rhythm: Normal rate and regular rhythm.  Pulmonary:     Effort: Pulmonary effort is normal. No respiratory distress.     Breath sounds: Normal breath sounds.  Abdominal:     General: Bowel sounds are normal.     Tenderness: There is no abdominal tenderness. There is no guarding.   Musculoskeletal:     Cervical back: No rigidity or tenderness.   Skin:    General: Skin is warm and dry.   Neurological:     Mental Status: He is alert and oriented to person, place, and time.   Psychiatric:        Mood and Affect: Mood normal.        Behavior: Behavior normal.      No results found for any visits on 09/26/23.    The 10-year ASCVD risk score (Arnett DK, et al., 2019) is: 6.7%    Assessment & Plan:   Healthcare maintenance  Primary hypertension -     CBC with Differential/Platelet -     Comprehensive metabolic panel with GFR -     Urinalysis, Routine w reflex microscopic  Screening for hyperlipidemia -     Comprehensive metabolic panel with GFR -     Lipid panel  Screening for diabetes mellitus -     Comprehensive metabolic panel with GFR -     Hemoglobin A1c  Cerumen debris on tympanic membrane of left ear -     Debrox; Place 5 drops into the left ear 2 (two) times daily.  Dispense: 15 mL; Refill: 4    Return in about 6 months (around 03/27/2024), or if symptoms worsen or fail to improve.  Continue regular exercise.  Information was given on health maintenance and disease prevention.  Information was given on ceruminosis and Rx for Debrox was sent.  Elsie Sim Lent, MD

## 2023-09-30 ENCOUNTER — Other Ambulatory Visit: Payer: Self-pay | Admitting: Family Medicine

## 2023-09-30 DIAGNOSIS — G43901 Migraine, unspecified, not intractable, with status migrainosus: Secondary | ICD-10-CM

## 2023-11-13 ENCOUNTER — Other Ambulatory Visit: Payer: Self-pay | Admitting: Family Medicine

## 2023-11-13 DIAGNOSIS — I10 Essential (primary) hypertension: Secondary | ICD-10-CM

## 2023-11-13 DIAGNOSIS — K219 Gastro-esophageal reflux disease without esophagitis: Secondary | ICD-10-CM

## 2023-11-13 DIAGNOSIS — F419 Anxiety disorder, unspecified: Secondary | ICD-10-CM

## 2023-11-14 ENCOUNTER — Other Ambulatory Visit: Payer: Self-pay | Admitting: Family Medicine

## 2023-11-14 DIAGNOSIS — I10 Essential (primary) hypertension: Secondary | ICD-10-CM

## 2023-12-18 NOTE — Progress Notes (Signed)
 Assessment: 1. Elevated PSA; negative bx 2/24; neg MRI 5/25     Plan: PSA today Will contact with results Return to office in 6 months  Chief Complaint: Chief Complaint  Patient presents with   Elevated PSA    HPI: Jeffery Rivas is a 58 y.o. male who presents for continued evaluation of elevated PSA.    History of elevated PSA. PSA results: 11/18   2.49 12/22   5.46 6/23     5.09 12/23   6.46   No family history of prostate cancer.  No history of UTIs or prostatitis. He does have lower urinary tract symptoms including frequency, occasional urgency, morning hesitancy, and nocturia x 1.  No dysuria or gross hematuria. IPSS = 11.  He is s/p a transrectal ultrasound and biopsy of the prostate on 06/01/22. TRUS volume:  45.9 ml  PSA density:  0.14 Biopsy results:  Negative for malignancy Complications after biopsy: none  PSA 9/24: 8.0 My Prostate Score 2.0 from 11/24 showed a 6.3% risk of clinically significant grade group 2 or higher prostate cancer on biopsy. Further evaluation with a prostate MRI discussed with the patient.  He elected to hold off at that time.  PSA 3/25:  9.6 with 9.8% free Prostate MRI from 5/25 showed no radiographic evidence of high grade prostate cancer; volume = 40.8 cc  He was seen on 06/08/23 for evaluation of right flank pain.  His symptoms began approximately 1 week prior with some recent worsening.  He had fairly constant pain in the right flank area with radiation into the right abdomen.  He had associated nausea without vomiting.  No fevers or chills.  He noted a decreased force of stream and 1 episode of dysuria.  No gross hematuria.  No history of kidney stones. He had not had a bowel movement in several days. CT renal stone study from 06/08/2023 showed no evidence of renal or ureteral calculi and no evidence of obstruction. No evidence of prostatitis on exam or UTI.  He was given a dose of Toradol  in the office and a prescription for  tramadol. His flank pain resolved.   He returns today for follow-up.  His lower urinary tract symptoms remain stable.  No dysuria or gross hematuria. IPSS = 8/3.  Portions of the above documentation were copied from a prior visit for review purposes only.  Allergies: Allergies  Allergen Reactions   Pollen Extract Other (See Comments)    PMH: Past Medical History:  Diagnosis Date   Anxiety    GERD (gastroesophageal reflux disease)    Hypertension     PSH: Past Surgical History:  Procedure Laterality Date   CHOLECYSTECTOMY N/A 05/18/2015   Procedure: LAPAROSCOPIC CHOLECYSTECTOMY WITH INTRAOPERATIVE CHOLANGIOGRAM;  Surgeon: Alm Angle, MD;  Location: [facility];  Service: General;  Laterality: N/A;   TONSILLECTOMY      SH: Social History   Tobacco Use   Smoking status: Never   Smokeless tobacco: Never  Vaping Use   Vaping status: Never Used  Substance Use Topics   Alcohol use: Yes    Comment: rare 2 beers a year   Drug use: No    ROS: Constitutional:  Negative for fever, chills, weight loss CV: Negative for chest pain, previous MI, hypertension Respiratory:  Negative for shortness of breath, wheezing, sleep apnea, frequent cough GI:  Negative for nausea, vomiting, bloody stool, GERD   PE: BP (!) 150/102   Pulse 60   Ht 5' 8 (1.727 m)  Wt 195 lb (88.5 kg)   BMI 29.65 kg/m  GENERAL APPEARANCE:  Well appearing, well developed, well nourished, NAD HEENT:  Atraumatic, normocephalic, oropharynx clear NECK:  Supple without lymphadenopathy or thyromegaly ABDOMEN:  Soft, non-tender, no masses EXTREMITIES:  Moves all extremities well, without clubbing, cyanosis, or edema NEUROLOGIC:  Alert and oriented x 3, normal gait, CN II-XII grossly intact MENTAL STATUS:  appropriate BACK:  Non-tender to palpation, No CVAT SKIN:  Warm, dry, and intact   Results: U/A: 0-5 WBCs, 0-2 RBCs

## 2023-12-19 ENCOUNTER — Ambulatory Visit: Admitting: Urology

## 2023-12-19 ENCOUNTER — Encounter: Payer: Self-pay | Admitting: Urology

## 2023-12-19 VITALS — BP 150/102 | HR 60 | Ht 68.0 in | Wt 195.0 lb

## 2023-12-19 DIAGNOSIS — R972 Elevated prostate specific antigen [PSA]: Secondary | ICD-10-CM | POA: Diagnosis not present

## 2023-12-19 LAB — URINALYSIS, ROUTINE W REFLEX MICROSCOPIC
Bilirubin, UA: NEGATIVE
Glucose, UA: NEGATIVE
Ketones, UA: NEGATIVE
Nitrite, UA: NEGATIVE
Protein,UA: NEGATIVE
RBC, UA: NEGATIVE
Specific Gravity, UA: 1.015 (ref 1.005–1.030)
Urobilinogen, Ur: 0.2 mg/dL (ref 0.2–1.0)
pH, UA: 6 (ref 5.0–7.5)

## 2023-12-19 LAB — MICROSCOPIC EXAMINATION: Bacteria, UA: NONE SEEN

## 2023-12-20 LAB — PSA: Prostate Specific Ag, Serum: 8.8 ng/mL — ABNORMAL HIGH (ref 0.0–4.0)

## 2023-12-21 ENCOUNTER — Ambulatory Visit: Payer: Self-pay | Admitting: Urology

## 2024-02-15 ENCOUNTER — Other Ambulatory Visit: Payer: Self-pay | Admitting: Family Medicine

## 2024-02-15 DIAGNOSIS — G43901 Migraine, unspecified, not intractable, with status migrainosus: Secondary | ICD-10-CM

## 2024-03-07 ENCOUNTER — Other Ambulatory Visit

## 2024-03-27 ENCOUNTER — Ambulatory Visit: Admitting: Family Medicine

## 2024-03-27 ENCOUNTER — Encounter: Payer: Self-pay | Admitting: Family Medicine

## 2024-03-27 VITALS — BP 136/80 | HR 72 | Temp 97.7°F | Ht 68.0 in | Wt 213.6 lb

## 2024-03-27 DIAGNOSIS — K219 Gastro-esophageal reflux disease without esophagitis: Secondary | ICD-10-CM | POA: Diagnosis not present

## 2024-03-27 DIAGNOSIS — G43901 Migraine, unspecified, not intractable, with status migrainosus: Secondary | ICD-10-CM

## 2024-03-27 DIAGNOSIS — I10 Essential (primary) hypertension: Secondary | ICD-10-CM | POA: Diagnosis not present

## 2024-03-27 DIAGNOSIS — F419 Anxiety disorder, unspecified: Secondary | ICD-10-CM | POA: Diagnosis not present

## 2024-03-27 DIAGNOSIS — Z131 Encounter for screening for diabetes mellitus: Secondary | ICD-10-CM

## 2024-03-27 LAB — BASIC METABOLIC PANEL WITH GFR
BUN: 12 mg/dL (ref 6–23)
CO2: 27 meq/L (ref 19–32)
Calcium: 9.1 mg/dL (ref 8.4–10.5)
Chloride: 104 meq/L (ref 96–112)
Creatinine, Ser: 0.94 mg/dL (ref 0.40–1.50)
GFR: 88.52 mL/min
Glucose, Bld: 101 mg/dL — ABNORMAL HIGH (ref 70–99)
Potassium: 4 meq/L (ref 3.5–5.1)
Sodium: 139 meq/L (ref 135–145)

## 2024-03-27 LAB — HEMOGLOBIN A1C: Hgb A1c MFr Bld: 5.4 % (ref 4.6–6.5)

## 2024-03-27 MED ORDER — AMLODIPINE BESYLATE 5 MG PO TABS: 5.0000 mg | ORAL_TABLET | Freq: Every day | ORAL | 1 refills | Status: AC

## 2024-03-27 MED ORDER — CITALOPRAM HYDROBROMIDE 10 MG PO TABS: 10.0000 mg | ORAL_TABLET | Freq: Every day | ORAL | 1 refills | Status: AC

## 2024-03-27 MED ORDER — CYCLOBENZAPRINE HCL 10 MG PO TABS: 10.0000 mg | ORAL_TABLET | Freq: Every day | ORAL | 1 refills | Status: AC

## 2024-03-27 MED ORDER — FAMOTIDINE 20 MG PO TABS: 20.0000 mg | ORAL_TABLET | Freq: Every day | ORAL | 1 refills | Status: AC

## 2024-03-27 MED ORDER — RIZATRIPTAN BENZOATE 10 MG PO TABS: ORAL_TABLET | ORAL | 1 refills | Status: AC

## 2024-03-27 MED ORDER — KETOROLAC TROMETHAMINE 10 MG PO TABS: 10.0000 mg | ORAL_TABLET | Freq: Three times a day (TID) | ORAL | 0 refills | Status: AC | PRN

## 2024-03-27 MED ORDER — ENALAPRIL MALEATE 20 MG PO TABS: 20.0000 mg | ORAL_TABLET | Freq: Every day | ORAL | 1 refills | Status: AC

## 2024-03-27 MED ORDER — CARVEDILOL 12.5 MG PO TABS: 12.5000 mg | ORAL_TABLET | Freq: Two times a day (BID) | ORAL | 1 refills | Status: AC

## 2024-03-27 NOTE — Progress Notes (Signed)
 "  Established Patient Office Visit   Subjective:  Patient ID: Jeffery Rivas, male    DOB: Aug 14, 1965  Age: 58 y.o. MRN: [id number]  Chief Complaint  Patient presents with   Hypertension    6 month f/u HTN.  No concerns. Fasting today.     Hypertension Pertinent negatives include no blurred vision.   Encounter Diagnoses  Name Primary?   Screening for diabetes mellitus Yes   Gastroesophageal reflux disease, unspecified whether esophagitis present    Primary hypertension    Anxiety    Migraine with status migrainosus, not intractable, unspecified migraine type    For follow-up of above.  Continues exercising when he can.  He was able to use the Debrox drops.  Was prescribed Toradol  and Flexeril  to use rarely for migraines that did not respond to his rizatriptan .   Review of Systems  Constitutional: Negative.   HENT: Negative.    Eyes:  Negative for blurred vision, discharge and redness.  Respiratory: Negative.    Cardiovascular: Negative.   Gastrointestinal:  Negative for abdominal pain.  Genitourinary: Negative.   Musculoskeletal: Negative.  Negative for myalgias.  Skin:  Negative for rash.  Neurological:  Negative for tingling, loss of consciousness and weakness.  Endo/Heme/Allergies:  Negative for polydipsia.    Current Medications[1]   Objective:     BP 136/80 (BP Location: Left Arm, Patient Position: Sitting, Cuff Size: Large)   Pulse 72   Temp 97.7 F (36.5 C) (Temporal)   Ht 5' 8 (1.727 m)   Wt 213 lb 9.6 oz (96.9 kg)   SpO2 99%   BMI 32.48 kg/m  BP Readings from Last 3 Encounters:  03/27/24 136/80  12/19/23 (!) 150/102  09/26/23 124/80   Wt Readings from Last 3 Encounters:  03/27/24 213 lb 9.6 oz (96.9 kg)  12/19/23 195 lb (88.5 kg)  09/26/23 201 lb 9.6 oz (91.4 kg)      Physical Exam Constitutional:      General: He is not in acute distress.    Appearance: Normal appearance. He is not ill-appearing, toxic-appearing or diaphoretic.  HENT:      Head: Normocephalic and atraumatic.     Right Ear: Tympanic membrane, ear canal and external ear normal.     Left Ear: Tympanic membrane, ear canal and external ear normal.  Eyes:     General: No scleral icterus.       Right eye: No discharge.        Left eye: No discharge.     Extraocular Movements: Extraocular movements intact.     Conjunctiva/sclera: Conjunctivae normal.  Cardiovascular:     Rate and Rhythm: Normal rate and regular rhythm.  Pulmonary:     Effort: Pulmonary effort is normal. No respiratory distress.     Breath sounds: Normal breath sounds. No wheezing or rales.  Musculoskeletal:     Cervical back: No rigidity or tenderness.  Skin:    General: Skin is warm and dry.  Neurological:     Mental Status: He is alert and oriented to person, place, and time.  Psychiatric:        Mood and Affect: Mood normal.        Behavior: Behavior normal.      No results found for any visits on 03/27/24.    The 10-year ASCVD risk score (Arnett DK, et al., 2019) is: 7.6%    Assessment & Plan:   Screening for diabetes mellitus -     Basic metabolic panel with GFR -  Hemoglobin A1c  Gastroesophageal reflux disease, unspecified whether esophagitis present -     Famotidine ; Take 1 tablet (20 mg total) by mouth daily.  Dispense: 90 tablet; Refill: 1  Primary hypertension -     Enalapril  Maleate; Take 1 tablet (20 mg total) by mouth daily.  Dispense: 90 tablet; Refill: 1 -     Carvedilol ; Take 1 tablet (12.5 mg total) by mouth 2 (two) times daily with a meal.  Dispense: 180 tablet; Refill: 1 -     amLODIPine  Besylate; Take 1 tablet (5 mg total) by mouth daily.  Dispense: 90 tablet; Refill: 1 -     Basic metabolic panel with GFR  Anxiety -     Citalopram  Hydrobromide; Take 1 tablet (10 mg total) by mouth daily.  Dispense: 90 tablet; Refill: 1  Migraine with status migrainosus, not intractable, unspecified migraine type -     Ketorolac  Tromethamine ; Take 1 tablet (10 mg  total) by mouth every 8 (eight) hours as needed.  Dispense: 20 tablet; Refill: 0 -     Rizatriptan  Benzoate; TAKE 1 TABLET BY MOUTH AT ONSET OF MIGRAINE; MAY REPEAT 1 TABLET IN 2 HOURS IF NEEDED.  Dispense: 9 tablet; Refill: 1  Other orders -     Cyclobenzaprine  HCl; Take 1 tablet (10 mg total) by mouth at bedtime.  Dispense: 30 tablet; Refill: 1    Return in about 6 months (around 09/25/2024), or Return in 6 months fasting for physical and follow-up of your blood pressure., for chronic disease follow-up, annual physical.  Advised patient to exercise and lose weight to avoid escalating doses of BP medications.  Information was given on managing hypertension and exercising to lose weight.  He keeps a small amount of Toradol  and Flexeril  for severe migraines that do not initially respond to the rizatriptan .  He rarely uses these medications Elsie Sim Lent, MD    [1]  Current Outpatient Medications:    cetirizine (ZYRTEC) 10 MG tablet, Take 10 mg by mouth daily., Disp: , Rfl:    cholecalciferol (VITAMIN D) 1000 units tablet, Take 1,000 Units by mouth daily., Disp: , Rfl:    Melatonin 3 MG CAPS, Take by mouth., Disp: , Rfl:    promethazine  (PHENERGAN ) 25 MG tablet, Take 1 tablet (25 mg total) by mouth every 8 (eight) hours as needed for nausea or vomiting., Disp: 30 tablet, Rfl: 0   amLODipine  (NORVASC ) 5 MG tablet, Take 1 tablet (5 mg total) by mouth daily., Disp: 90 tablet, Rfl: 1   carbamide peroxide (DEBROX) 6.5 % OTIC solution, Place 5 drops into the left ear 2 (two) times daily. (Patient not taking: Reported on 03/27/2024), Disp: 15 mL, Rfl: 4   carvedilol  (COREG ) 12.5 MG tablet, Take 1 tablet (12.5 mg total) by mouth 2 (two) times daily with a meal., Disp: 180 tablet, Rfl: 1   citalopram  (CELEXA ) 10 MG tablet, Take 1 tablet (10 mg total) by mouth daily., Disp: 90 tablet, Rfl: 1   cyclobenzaprine  (FLEXERIL ) 10 MG tablet, Take 1 tablet (10 mg total) by mouth at bedtime., Disp: 30  tablet, Rfl: 1   enalapril  (VASOTEC ) 20 MG tablet, Take 1 tablet (20 mg total) by mouth daily., Disp: 90 tablet, Rfl: 1   famotidine  (PEPCID ) 20 MG tablet, Take 1 tablet (20 mg total) by mouth daily., Disp: 90 tablet, Rfl: 1   ketorolac  (TORADOL ) 10 MG tablet, Take 1 tablet (10 mg total) by mouth every 8 (eight) hours as needed., Disp: 20 tablet, Rfl: 0  rizatriptan  (MAXALT ) 10 MG tablet, TAKE 1 TABLET BY MOUTH AT ONSET OF MIGRAINE; MAY REPEAT 1 TABLET IN 2 HOURS IF NEEDED., Disp: 9 tablet, Rfl: 1  "

## 2024-03-30 ENCOUNTER — Ambulatory Visit: Payer: Self-pay | Admitting: Family Medicine

## 2024-06-18 ENCOUNTER — Ambulatory Visit: Admitting: Urology

## 2024-06-26 ENCOUNTER — Ambulatory Visit: Admitting: Urology

## 2024-09-27 ENCOUNTER — Encounter: Admitting: Family Medicine
